# Patient Record
Sex: Male | Born: 1955 | Race: White | Hispanic: No | Marital: Married | Smoking: Former smoker
Health system: Southern US, Community
[De-identification: ages and names within clinical notes are randomized; demographics above are authoritative.]

## PROBLEM LIST (undated history)

## (undated) DIAGNOSIS — J40 Bronchitis, not specified as acute or chronic: Secondary | ICD-10-CM

## (undated) DIAGNOSIS — E039 Hypothyroidism, unspecified: Secondary | ICD-10-CM

## (undated) DIAGNOSIS — J310 Chronic rhinitis: Secondary | ICD-10-CM

## (undated) HISTORY — PX: TRIGGER FINGER RELEASE: SHX641

## (undated) HISTORY — DX: Bronchitis, not specified as acute or chronic: J40

## (undated) HISTORY — DX: Hypothyroidism, unspecified: E03.9

## (undated) HISTORY — DX: Chronic rhinitis: J31.0

---

## 1999-05-28 ENCOUNTER — Encounter: Admission: RE | Admit: 1999-05-28 | Discharge: 1999-07-06 | Payer: Self-pay | Admitting: Orthopedic Surgery

## 1999-12-28 ENCOUNTER — Encounter: Admission: RE | Admit: 1999-12-28 | Discharge: 2000-01-31 | Payer: Self-pay | Admitting: Orthopedic Surgery

## 2001-03-03 ENCOUNTER — Encounter: Payer: Self-pay | Admitting: Occupational Medicine

## 2001-03-03 ENCOUNTER — Encounter: Admission: RE | Admit: 2001-03-03 | Discharge: 2001-03-03 | Payer: Self-pay | Admitting: Occupational Medicine

## 2004-10-04 ENCOUNTER — Encounter: Admission: RE | Admit: 2004-10-04 | Discharge: 2004-10-04 | Payer: Self-pay | Admitting: Internal Medicine

## 2010-08-16 ENCOUNTER — Encounter
Admission: RE | Admit: 2010-08-16 | Discharge: 2010-11-01 | Payer: Self-pay | Source: Home / Self Care | Attending: Sports Medicine | Admitting: Sports Medicine

## 2011-04-03 ENCOUNTER — Encounter: Payer: Self-pay | Admitting: Physician Assistant

## 2013-06-09 ENCOUNTER — Other Ambulatory Visit: Payer: Self-pay | Admitting: Nurse Practitioner

## 2013-07-11 ENCOUNTER — Other Ambulatory Visit: Payer: Self-pay | Admitting: Family Medicine

## 2013-07-13 NOTE — Telephone Encounter (Signed)
Last thyroids 06/08/12   ACM

## 2013-07-14 NOTE — Telephone Encounter (Signed)
Patient needs to be seen. Has exceeded time since last visit. Needs to bring all medications to next appointment.   

## 2013-07-15 ENCOUNTER — Telehealth: Payer: Self-pay | Admitting: Family Medicine

## 2013-07-15 NOTE — Telephone Encounter (Signed)
Spoke with wife and she will sch appt

## 2013-07-16 ENCOUNTER — Other Ambulatory Visit: Payer: Self-pay | Admitting: Family Medicine

## 2013-07-22 ENCOUNTER — Encounter: Payer: Self-pay | Admitting: Family Medicine

## 2013-07-22 ENCOUNTER — Ambulatory Visit (INDEPENDENT_AMBULATORY_CARE_PROVIDER_SITE_OTHER): Payer: BC Managed Care – PPO | Admitting: Family Medicine

## 2013-07-22 VITALS — BP 135/81 | HR 59 | Temp 96.3°F | Ht 68.0 in | Wt 235.0 lb

## 2013-07-22 DIAGNOSIS — M109 Gout, unspecified: Secondary | ICD-10-CM

## 2013-07-22 DIAGNOSIS — E039 Hypothyroidism, unspecified: Secondary | ICD-10-CM

## 2013-07-22 DIAGNOSIS — Z0000 Encounter for general adult medical examination without abnormal findings: Secondary | ICD-10-CM

## 2013-07-22 LAB — POCT CBC
Granulocyte percent: 54.4 %G (ref 37–80)
HCT, POC: 42.7 % — AB (ref 43.5–53.7)
Hemoglobin: 14.7 g/dL (ref 14.1–18.1)
Lymph, poc: 2.5 (ref 0.6–3.4)
MCH, POC: 31.6 pg — AB (ref 27–31.2)
MCHC: 34.5 g/dL (ref 31.8–35.4)
MCV: 91.6 fL (ref 80–97)
MPV: 6.5 fL (ref 0–99.8)
POC Granulocyte: 3.3 (ref 2–6.9)
POC LYMPH PERCENT: 40.4 %L (ref 10–50)
Platelet Count, POC: 253 10*3/uL (ref 142–424)
RBC: 4.7 M/uL (ref 4.69–6.13)
RDW, POC: 13.5 %
WBC: 6.1 10*3/uL (ref 4.6–10.2)

## 2013-07-22 MED ORDER — ALLOPURINOL 300 MG PO TABS: 300.0000 mg | ORAL_TABLET | Freq: Every day | ORAL | Status: DC

## 2013-07-22 MED ORDER — LEVOTHYROXINE SODIUM 25 MCG PO TABS: 25.0000 ug | ORAL_TABLET | Freq: Every day | ORAL | Status: DC

## 2013-07-22 NOTE — Progress Notes (Signed)
  Subjective:    Patient ID: Don Nelson, male    DOB: 1956/07/18, 57 y.o.   MRN: [id number]  HPI  This 57 y.o. male presents for evaluation of needing refills on medicine.  He has hx of gout And is taking allopurinol 300mg  and this is working well and he has used prednisone on a few Occasions since January for flares.  He is due for annual labs.  Review of Systems No chest pain, SOB, HA, dizziness, vision change, N/V, diarrhea, constipation, dysuria, urinary urgency or frequency, myalgias, arthralgias or rash.     Objective:   Physical Exam Vital signs noted  Well developed well nourished male.  HEENT - Head atraumatic Normocephalic                Eyes - PERRLA, Conjuctiva - clear Sclera- Clear EOMI                Ears - EAC's Wnl TM's Wnl Gross Hearing WNL                Nose - Nares patent                 Throat - oropharanx wnl Respiratory - Lungs CTA bilateral Cardiac - RRR S1 and S2 without murmur GI - Abdomen soft Nontender and bowel sounds active x 4 Extremities - No edema. Neuro - Grossly intact.       Assessment & Plan:  Gout - Plan: allopurinol (ZYLOPRIM) 300 MG tablet  Routine general medical examination at a health care facility - Plan: POCT CBC, CMP14+EGFR, PSA, total and free, Thyroid Panel With TSH, Lipid panel, Uric acid  Unspecified hypothyroidism - Plan: levothyroxine (SYNTHROID, LEVOTHROID) 25 MCG tablet, Thyroid Panel With TSH

## 2013-07-22 NOTE — Patient Instructions (Signed)
Gout  Gout is an inflammatory condition (arthritis) caused by a buildup of uric acid crystals in the joints. Uric acid is a chemical that is normally present in the blood. Under some circumstances, uric acid can form into crystals in your joints. This causes joint redness, soreness, and swelling (inflammation). Repeat attacks are common. Over time, uric acid crystals can form into masses (tophi) near a joint, causing disfigurement. Gout is treatable and often preventable.  CAUSES   The disease begins with elevated levels of uric acid in the blood. Uric acid is produced by your body when it breaks down a naturally found substance called purines. This also happens when you eat certain foods such as meats and fish. Causes of an elevated uric acid level include:   Being passed down from parent to child (heredity).   Diseases that cause increased uric acid production (obesity, psoriasis, some cancers).   Excessive alcohol use.   Diet, especially diets rich in meat and seafood.   Medicines, including certain cancer-fighting drugs (chemotherapy), diuretics, and aspirin.   Chronic kidney disease. The kidneys are no longer able to remove uric acid well.   Problems with metabolism.  Conditions strongly associated with gout include:   Obesity.   High blood pressure.   High cholesterol.   Diabetes.  Not everyone with elevated uric acid levels gets gout. It is not understood why some people get gout and others do not. Surgery, joint injury, and eating too much of certain foods are some of the factors that can lead to gout.  SYMPTOMS    An attack of gout comes on quickly. It causes intense pain with redness, swelling, and warmth in a joint.   Fever can occur.   Often, only one joint is involved. Certain joints are more commonly involved:   Base of the big toe.   Knee.   Ankle.   Wrist.   Finger.  Without treatment, an attack usually goes away in a few days to weeks. Between attacks, you usually will not have  symptoms, which is different from many other forms of arthritis.  DIAGNOSIS   Your caregiver will suspect gout based on your symptoms and exam. Removal of fluid from the joint (arthrocentesis) is done to check for uric acid crystals. Your caregiver will give you a medicine that numbs the area (local anesthetic) and use a needle to remove joint fluid for exam. Gout is confirmed when uric acid crystals are seen in joint fluid, using a special microscope. Sometimes, blood, urine, and X-ray tests are also used.  TREATMENT   There are 2 phases to gout treatment: treating the sudden onset (acute) attack and preventing attacks (prophylaxis).  Treatment of an Acute Attack   Medicines are used. These include anti-inflammatory medicines or steroid medicines.   An injection of steroid medicine into the affected joint is sometimes necessary.   The painful joint is rested. Movement can worsen the arthritis.   You may use warm or cold treatments on painful joints, depending which works best for you.   Discuss the use of coffee, vitamin C, or cherries with your caregiver. These may be helpful treatment options.  Treatment to Prevent Attacks  After the acute attack subsides, your caregiver may advise prophylactic medicine. These medicines either help your kidneys eliminate uric acid from your body or decrease your uric acid production. You may need to stay on these medicines for a very long time.  The early phase of treatment with prophylactic medicine can be associated   with an increase in acute gout attacks. For this reason, during the first few months of treatment, your caregiver may also advise you to take medicines usually used for acute gout treatment. Be sure you understand your caregiver's directions.  You should also discuss dietary treatment with your caregiver. Certain foods such as meats and fish can increase uric acid levels. Other foods such as dairy can decrease levels. Your caregiver can give you a list of foods  to avoid.  HOME CARE INSTRUCTIONS    Do not take aspirin to relieve pain. This raises uric acid levels.   Only take over-the-counter or prescription medicines for pain, discomfort, or fever as directed by your caregiver.   Rest the joint as much as possible. When in bed, keep sheets and blankets off painful areas.   Keep the affected joint raised (elevated).   Use crutches if the painful joint is in your leg.   Drink enough water and fluids to keep your urine clear or pale yellow. This helps your body get rid of uric acid. Do not drink alcoholic beverages. They slow the passage of uric acid.   Follow your caregiver's dietary instructions. Pay careful attention to the amount of protein you eat. Your daily diet should emphasize fruits, vegetables, whole grains, and fat-free or low-fat milk products.   Maintain a healthy body weight.  SEEK MEDICAL CARE IF:    You have an oral temperature above 102 F (38.9 C).   You develop diarrhea, vomiting, or any side effects from medicines.   You do not feel better in 24 hours, or you are getting worse.  SEEK IMMEDIATE MEDICAL CARE IF:    Your joint becomes suddenly more tender and you have:   Chills.   An oral temperature above 102 F (38.9 C), not controlled by medicine.  MAKE SURE YOU:    Understand these instructions.   Will watch your condition.   Will get help right away if you are not doing well or get worse.  Document Released: 10/18/2000 Document Revised: 01/13/2012 Document Reviewed: 01/29/2010  [employer] Patient Information 2014 [employer].

## 2013-07-23 ENCOUNTER — Other Ambulatory Visit: Payer: Self-pay | Admitting: Family Medicine

## 2013-07-23 DIAGNOSIS — E039 Hypothyroidism, unspecified: Secondary | ICD-10-CM

## 2013-07-23 LAB — CMP14+EGFR
ALT: 24 IU/L (ref 0–44)
AST: 22 IU/L (ref 0–40)
Albumin/Globulin Ratio: 1.4 (ref 1.1–2.5)
Albumin: 4.2 g/dL (ref 3.5–5.5)
Alkaline Phosphatase: 55 IU/L (ref 39–117)
BUN/Creatinine Ratio: 10 (ref 9–20)
BUN: 11 mg/dL (ref 6–24)
CO2: 23 mmol/L (ref 18–29)
Calcium: 9.2 mg/dL (ref 8.7–10.2)
Chloride: 101 mmol/L (ref 97–108)
Creatinine, Ser: 1.13 mg/dL (ref 0.76–1.27)
GFR calc Af Amer: 83 mL/min/{1.73_m2} (ref >59)
GFR calc non Af Amer: 72 mL/min/{1.73_m2} (ref >59)
Globulin, Total: 3 g/dL (ref 1.5–4.5)
Glucose: 77 mg/dL (ref 65–99)
Potassium: 4.2 mmol/L (ref 3.5–5.2)
Sodium: 141 mmol/L (ref 134–144)
Total Bilirubin: 0.9 mg/dL (ref 0.0–1.2)
Total Protein: 7.2 g/dL (ref 6.0–8.5)

## 2013-07-23 LAB — LIPID PANEL
Chol/HDL Ratio: 5.4 ratio — ABNORMAL HIGH (ref 0.0–5.0)
Cholesterol, Total: 177 mg/dL (ref 100–199)
HDL: 33 mg/dL — ABNORMAL LOW
LDL Calculated: 102 mg/dL — ABNORMAL HIGH (ref 0–99)
Triglycerides: 212 mg/dL — ABNORMAL HIGH (ref 0–149)
VLDL Cholesterol Cal: 42 mg/dL — ABNORMAL HIGH (ref 5–40)

## 2013-07-23 LAB — PSA, TOTAL AND FREE
PSA, Free Pct: 60 %
PSA, Free: 0.36 ng/mL
PSA: 0.6 ng/mL (ref 0.0–4.0)

## 2013-07-23 LAB — THYROID PANEL WITH TSH
Free Thyroxine Index: 1.7 (ref 1.2–4.9)
T3 Uptake Ratio: 31 % (ref 24–39)
T4, Total: 5.4 ug/dL (ref 4.5–12.0)
TSH: 5.82 u[IU]/mL — ABNORMAL HIGH (ref 0.450–4.500)

## 2013-07-23 LAB — URIC ACID: Uric Acid: 5.1 mg/dL (ref 3.7–8.6)

## 2013-07-23 MED ORDER — LEVOTHYROXINE SODIUM 50 MCG PO TABS: 50.0000 ug | ORAL_TABLET | Freq: Every day | ORAL | Status: DC

## 2013-08-02 ENCOUNTER — Telehealth: Payer: Self-pay | Admitting: *Deleted

## 2013-08-02 NOTE — Telephone Encounter (Addendum)
Message copied by Baltazar Apo on Mon Aug 02, 2013 10:35 AM ------      Message from: Deatra Canter      Created: Fri Jul 23, 2013  7:44 AM       Trigs elevated and take fish oil otc as directed.  TSH is elevated and take levothyroxine po qd and rx will be sent in. ------NOTIFIED AND VERBALIZED UNDERSTANDING.

## 2013-08-12 ENCOUNTER — Ambulatory Visit (INDEPENDENT_AMBULATORY_CARE_PROVIDER_SITE_OTHER): Payer: BC Managed Care – PPO

## 2013-08-12 DIAGNOSIS — Z23 Encounter for immunization: Secondary | ICD-10-CM

## 2013-08-15 ENCOUNTER — Other Ambulatory Visit: Payer: Self-pay | Admitting: Family Medicine

## 2013-10-17 ENCOUNTER — Other Ambulatory Visit: Payer: Self-pay | Admitting: Family Medicine

## 2014-01-11 ENCOUNTER — Other Ambulatory Visit: Payer: Self-pay | Admitting: Family Medicine

## 2014-04-12 ENCOUNTER — Other Ambulatory Visit: Payer: Self-pay | Admitting: Family Medicine

## 2014-04-20 ENCOUNTER — Other Ambulatory Visit: Payer: Self-pay | Admitting: Family Medicine

## 2014-04-22 ENCOUNTER — Other Ambulatory Visit: Payer: Self-pay | Admitting: Family Medicine

## 2014-04-26 ENCOUNTER — Encounter: Payer: Self-pay | Admitting: Family

## 2014-04-26 ENCOUNTER — Other Ambulatory Visit: Payer: Self-pay | Admitting: Family Medicine

## 2014-04-26 ENCOUNTER — Ambulatory Visit (INDEPENDENT_AMBULATORY_CARE_PROVIDER_SITE_OTHER): Payer: BC Managed Care – PPO | Admitting: Family

## 2014-04-26 VITALS — BP 126/80 | HR 64 | Temp 97.5°F | Ht 68.0 in | Wt 242.0 lb

## 2014-04-26 DIAGNOSIS — Z862 Personal history of diseases of the blood and blood-forming organs and certain disorders involving the immune mechanism: Secondary | ICD-10-CM

## 2014-04-26 DIAGNOSIS — E038 Other specified hypothyroidism: Secondary | ICD-10-CM

## 2014-04-26 DIAGNOSIS — Z8739 Personal history of other diseases of the musculoskeletal system and connective tissue: Secondary | ICD-10-CM

## 2014-04-26 DIAGNOSIS — Z0000 Encounter for general adult medical examination without abnormal findings: Secondary | ICD-10-CM

## 2014-04-26 DIAGNOSIS — Z8639 Personal history of other endocrine, nutritional and metabolic disease: Secondary | ICD-10-CM

## 2014-04-26 DIAGNOSIS — E039 Hypothyroidism, unspecified: Secondary | ICD-10-CM | POA: Insufficient documentation

## 2014-04-26 DIAGNOSIS — M109 Gout, unspecified: Secondary | ICD-10-CM

## 2014-04-26 DIAGNOSIS — M10471 Other secondary gout, right ankle and foot: Secondary | ICD-10-CM

## 2014-04-26 MED ORDER — LEVOTHYROXINE SODIUM 50 MCG PO TABS: 50.0000 ug | ORAL_TABLET | Freq: Every day | ORAL | Status: DC

## 2014-04-26 MED ORDER — ALLOPURINOL 300 MG PO TABS: 300.0000 mg | ORAL_TABLET | Freq: Every day | ORAL | Status: DC

## 2014-04-26 NOTE — Patient Instructions (Signed)

## 2014-04-26 NOTE — Addendum Note (Signed)
Addended by: Prescott GumLAND, Cedric Mcclaine M on: 04/26/2014 05:16 PM   Modules accepted: Orders

## 2014-04-26 NOTE — Progress Notes (Signed)
   Subjective:    Patient ID: Don Nelson, male    DOB: 01-10-1956, 58 y.o.   MRN: [id number]  Thyroid Problem Presents for follow-up visit. Patient reports no anxiety, cold intolerance, constipation, depressed mood, diarrhea, dry skin, fatigue or hair loss. Past treatments include levothyroxine. The treatment provided significant relief.  Gout Pt currently taking allopurinol daily. Pt states he has not had an attack in "years". No complaints at this time.     Review of Systems  Constitutional: Negative for fatigue.  Respiratory: Negative.   Cardiovascular: Negative.   Gastrointestinal: Negative.  Negative for diarrhea and constipation.  Endocrine: Negative for cold intolerance.  Genitourinary: Negative.   Neurological: Negative.   All other systems reviewed and are negative.      Objective:   Physical Exam  Vitals reviewed. Constitutional: He is oriented to person, place, and time. He appears well-developed and well-nourished. No distress.  HENT:  Head: Normocephalic.  Right Ear: External ear normal.  Left Ear: External ear normal.  Mouth/Throat: Oropharynx is clear and moist.  Eyes: Pupils are equal, round, and reactive to light. Right eye exhibits no discharge. Left eye exhibits no discharge.  Neck: Normal range of motion. Neck supple. No thyromegaly present.  Cardiovascular: Normal rate, regular rhythm, normal heart sounds and intact distal pulses.   No murmur heard. Pulmonary/Chest: Effort normal and breath sounds normal. No respiratory distress. He has no wheezes.  Abdominal: Soft. Bowel sounds are normal. He exhibits no distension. There is no tenderness.  Musculoskeletal: Normal range of motion. He exhibits no edema and no tenderness.  Neurological: He is alert and oriented to person, place, and time. He has normal reflexes. No cranial nerve deficit.  Skin: Skin is warm and dry. No rash noted. No erythema.  Psychiatric: He has a normal mood and affect. His behavior  is normal. Judgment and thought content normal.    BP 126/80  Pulse 64  Temp(Src) 97.5 F (36.4 C) (Oral)  Ht $R'5\' 8"'xJ$  (1.727 m)  Wt 242 lb (109.77 kg)  BMI 36.80 kg/m2       Assessment & Plan:  1. Other specified hypothyroidism - CMP14+EGFR - CBC With differential/Platelet - Lipid panel - Thyroid Panel With TSH  2. History of gout - Uric acid  3. Unspecified hypothyroidism - levothyroxine (SYNTHROID, LEVOTHROID) 50 MCG tablet; Take 1 tablet (50 mcg total) by mouth daily.  Dispense: 90 tablet; Refill: 3  4. 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  Dispense: 90 tablet; Refill: 3  6. Annual physical exam - CMP14+EGFR - Lipid panel - Vit D  25 hydroxy (rtn osteoporosis monitoring)   Continue all meds Labs pending Health Maintenance reviewed Diet and exercise encouraged RTO 1 year  Evelina Dun, FNP

## 2014-04-26 NOTE — Addendum Note (Signed)
Addended by: Prescott GumLAND, JESSICA M on: 04/26/2014 05:17 PM   Modules accepted: Orders

## 2014-04-27 ENCOUNTER — Telehealth: Payer: Self-pay | Admitting: Family Medicine

## 2014-04-27 LAB — CBC WITH DIFFERENTIAL
Basophils Absolute: 0 10*3/uL (ref 0.0–0.2)
Basos: 1 %
EOS ABS: 0.2 10*3/uL (ref 0.0–0.4)
EOS: 3 %
HCT: 39.5 % (ref 37.5–51.0)
Hemoglobin: 13.3 g/dL (ref 12.6–17.7)
IMMATURE GRANS (ABS): 0 10*3/uL (ref 0.0–0.1)
IMMATURE GRANULOCYTES: 0 %
Lymphocytes Absolute: 2.4 10*3/uL (ref 0.7–3.1)
Lymphs: 44 %
MCH: 31.4 pg (ref 26.6–33.0)
MCHC: 33.7 g/dL (ref 31.5–35.7)
MCV: 93 fL (ref 79–97)
MONOS ABS: 0.4 10*3/uL (ref 0.1–0.9)
Monocytes: 8 %
NEUTROS PCT: 44 %
Neutrophils Absolute: 2.4 10*3/uL (ref 1.4–7.0)
PLATELETS: 278 10*3/uL (ref 150–379)
RBC: 4.23 x10E6/uL (ref 4.14–5.80)
RDW: 14.3 % (ref 12.3–15.4)
WBC: 5.5 10*3/uL (ref 3.4–10.8)

## 2014-04-27 LAB — CMP14+EGFR
ALT: 31 IU/L (ref 0–44)
AST: 29 IU/L (ref 0–40)
Albumin/Globulin Ratio: 1.4 (ref 1.1–2.5)
Albumin: 4.4 g/dL (ref 3.5–5.5)
Alkaline Phosphatase: 54 IU/L (ref 39–117)
BUN/Creatinine Ratio: 11 (ref 9–20)
BUN: 14 mg/dL (ref 6–24)
CALCIUM: 9.5 mg/dL (ref 8.7–10.2)
CO2: 22 mmol/L (ref 18–29)
Chloride: 102 mmol/L (ref 97–108)
Creatinine, Ser: 1.31 mg/dL — ABNORMAL HIGH (ref 0.76–1.27)
GFR calc Af Amer: 69 mL/min/{1.73_m2} (ref >59)
GFR calc non Af Amer: 60 mL/min/{1.73_m2} (ref >59)
Globulin, Total: 3.1 g/dL (ref 1.5–4.5)
Glucose: 92 mg/dL (ref 65–99)
POTASSIUM: 4.9 mmol/L (ref 3.5–5.2)
SODIUM: 143 mmol/L (ref 134–144)
Total Bilirubin: 0.9 mg/dL (ref 0.0–1.2)
Total Protein: 7.5 g/dL (ref 6.0–8.5)

## 2014-04-27 LAB — URIC ACID: URIC ACID: 8.1 mg/dL (ref 3.7–8.6)

## 2014-04-27 LAB — LIPID PANEL
Chol/HDL Ratio: 5.6 ratio units — ABNORMAL HIGH (ref 0.0–5.0)
Cholesterol, Total: 179 mg/dL (ref 100–199)
HDL: 32 mg/dL — ABNORMAL LOW (ref >39)
LDL Calculated: 97 mg/dL (ref 0–99)
Triglycerides: 251 mg/dL — ABNORMAL HIGH (ref 0–149)
VLDL Cholesterol Cal: 50 mg/dL — ABNORMAL HIGH (ref 5–40)

## 2014-04-27 LAB — VITAMIN D 25 HYDROXY (VIT D DEFICIENCY, FRACTURES): Vit D, 25-Hydroxy: 22.7 ng/mL — ABNORMAL LOW (ref 30.0–100.0)

## 2014-04-27 LAB — THYROID PANEL WITH TSH
FREE THYROXINE INDEX: 1.9 (ref 1.2–4.9)
T3 UPTAKE RATIO: 28 % (ref 24–39)
T4 TOTAL: 6.7 ug/dL (ref 4.5–12.0)
TSH: 4.68 u[IU]/mL — AB (ref 0.450–4.500)

## 2014-04-27 NOTE — Telephone Encounter (Signed)
Message copied by Azalee CourseFULP, ASHLEY on Wed Apr 27, 2014  9:15 AM ------      Message from: Lendon ColonelHAWKS, [place] A      Created: Wed Apr 27, 2014  8:51 AM       CBC (WBC, Hgb, & plts) WNL      Triglycerides are high- RX sent to pharmacy and need to be on low fat diet and exercise      Uric Acid WNL      Thyroid levels stable      Kidney and liver function stable      Vit D levels low- Need to start on Vit D OTC ------

## 2014-04-28 NOTE — Telephone Encounter (Signed)
Patient aware.

## 2014-04-29 ENCOUNTER — Telehealth: Payer: Self-pay | Admitting: Family

## 2014-04-29 NOTE — Telephone Encounter (Signed)
Message copied by Doreatha MassedMOORE, MITZI on Fri Apr 29, 2014  3:09 PM ------      Message from: [place], [place] A      Created: Wed Apr 27, 2014  8:51 AM       CBC (WBC, Hgb, & plts) WNL      Triglycerides are high- RX sent to pharmacy and need to be on low fat diet and exercise      Uric Acid WNL      Thyroid levels stable      Kidney and liver function stable      Vit D levels low- Need to start on Vit D OTC ------

## 2014-04-30 NOTE — Telephone Encounter (Signed)
Patient aware of results on 6/24

## 2014-07-21 ENCOUNTER — Other Ambulatory Visit: Payer: Self-pay | Admitting: Family Medicine

## 2014-07-22 ENCOUNTER — Other Ambulatory Visit: Payer: Self-pay | Admitting: Family Medicine

## 2015-04-15 ENCOUNTER — Other Ambulatory Visit: Payer: Self-pay | Admitting: Family

## 2015-04-21 ENCOUNTER — Other Ambulatory Visit (INDEPENDENT_AMBULATORY_CARE_PROVIDER_SITE_OTHER): Payer: BLUE CROSS/BLUE SHIELD

## 2015-04-21 DIAGNOSIS — E559 Vitamin D deficiency, unspecified: Secondary | ICD-10-CM

## 2015-04-21 DIAGNOSIS — E039 Hypothyroidism, unspecified: Secondary | ICD-10-CM | POA: Diagnosis not present

## 2015-04-21 NOTE — Progress Notes (Signed)
Lab only 

## 2015-04-22 ENCOUNTER — Other Ambulatory Visit: Payer: Self-pay | Admitting: Family

## 2015-04-22 DIAGNOSIS — E559 Vitamin D deficiency, unspecified: Secondary | ICD-10-CM

## 2015-04-22 LAB — CMP14+EGFR
A/G RATIO: 1.3 (ref 1.1–2.5)
ALT: 20 IU/L (ref 0–44)
AST: 23 IU/L (ref 0–40)
Albumin: 4.1 g/dL (ref 3.5–5.5)
Alkaline Phosphatase: 49 IU/L (ref 39–117)
BUN/Creatinine Ratio: 11 (ref 9–20)
BUN: 15 mg/dL (ref 6–24)
Bilirubin Total: 1.6 mg/dL — ABNORMAL HIGH (ref 0.0–1.2)
CO2: 23 mmol/L (ref 18–29)
Calcium: 9.5 mg/dL (ref 8.7–10.2)
Chloride: 103 mmol/L (ref 97–108)
Creatinine, Ser: 1.31 mg/dL — ABNORMAL HIGH (ref 0.76–1.27)
GFR calc non Af Amer: 59 mL/min/{1.73_m2} — ABNORMAL LOW (ref >59)
GFR, EST AFRICAN AMERICAN: 68 mL/min/{1.73_m2} (ref >59)
GLOBULIN, TOTAL: 3.2 g/dL (ref 1.5–4.5)
Glucose: 117 mg/dL — ABNORMAL HIGH (ref 65–99)
POTASSIUM: 4.1 mmol/L (ref 3.5–5.2)
SODIUM: 142 mmol/L (ref 134–144)
Total Protein: 7.3 g/dL (ref 6.0–8.5)

## 2015-04-22 LAB — THYROID PANEL WITH TSH
Free Thyroxine Index: 1.9 (ref 1.2–4.9)
T3 Uptake Ratio: 29 % (ref 24–39)
T4, Total: 6.7 ug/dL (ref 4.5–12.0)
TSH: 3.82 u[IU]/mL (ref 0.450–4.500)

## 2015-04-22 LAB — LIPID PANEL
Chol/HDL Ratio: 5.6 ratio units — ABNORMAL HIGH (ref 0.0–5.0)
Cholesterol, Total: 173 mg/dL (ref 100–199)
HDL: 31 mg/dL — ABNORMAL LOW (ref >39)
LDL Calculated: 92 mg/dL (ref 0–99)
TRIGLYCERIDES: 250 mg/dL — AB (ref 0–149)
VLDL Cholesterol Cal: 50 mg/dL — ABNORMAL HIGH (ref 5–40)

## 2015-04-22 LAB — VITAMIN D 25 HYDROXY (VIT D DEFICIENCY, FRACTURES): Vit D, 25-Hydroxy: 18.8 ng/mL — ABNORMAL LOW (ref 30.0–100.0)

## 2015-04-22 MED ORDER — VITAMIN D (ERGOCALCIFEROL) 1.25 MG (50000 UNIT) PO CAPS: 50000.0000 [IU] | ORAL_CAPSULE | ORAL | Status: DC

## 2015-04-28 ENCOUNTER — Encounter: Payer: Self-pay | Admitting: Family

## 2015-04-28 ENCOUNTER — Ambulatory Visit (INDEPENDENT_AMBULATORY_CARE_PROVIDER_SITE_OTHER): Payer: BLUE CROSS/BLUE SHIELD | Admitting: Family

## 2015-04-28 VITALS — BP 131/82 | HR 64 | Temp 97.7°F | Ht 68.0 in | Wt 238.6 lb

## 2015-04-28 DIAGNOSIS — M10471 Other secondary gout, right ankle and foot: Secondary | ICD-10-CM | POA: Diagnosis not present

## 2015-04-28 DIAGNOSIS — E039 Hypothyroidism, unspecified: Secondary | ICD-10-CM | POA: Diagnosis not present

## 2015-04-28 DIAGNOSIS — Z0000 Encounter for general adult medical examination without abnormal findings: Secondary | ICD-10-CM | POA: Diagnosis not present

## 2015-04-28 DIAGNOSIS — E559 Vitamin D deficiency, unspecified: Secondary | ICD-10-CM | POA: Diagnosis not present

## 2015-04-28 MED ORDER — LEVOTHYROXINE SODIUM 50 MCG PO TABS: 50.0000 ug | ORAL_TABLET | Freq: Every day | ORAL | Status: DC

## 2015-04-28 MED ORDER — ALLOPURINOL 300 MG PO TABS: 300.0000 mg | ORAL_TABLET | Freq: Every day | ORAL | Status: DC

## 2015-04-28 NOTE — Patient Instructions (Signed)

## 2015-04-28 NOTE — Progress Notes (Signed)
   Subjective:    Patient ID: Don Nelson, male    DOB: [birth year], 59 y.o.   MRN: [id number]  Pt presents to the office for CPE.  Thyroid Problem Presents for follow-up visit. Patient reports no anxiety, cold intolerance, depressed mood, diarrhea, dry skin, fatigue or palpitations. The symptoms have been stable. Past treatments include levothyroxine. The treatment provided significant relief.  GOUT Pt states he takes the allopurinol daily and has not had a "gout attack" in years.     Review of Systems  Constitutional: Negative.  Negative for fatigue.  HENT: Negative.   Respiratory: Negative.   Cardiovascular: Negative.  Negative for palpitations.  Gastrointestinal: Negative.  Negative for diarrhea.  Endocrine: Negative.  Negative for cold intolerance.  Genitourinary: Negative.   Musculoskeletal: Negative.   Neurological: Negative.   Hematological: Negative.   Psychiatric/Behavioral: Negative.  The patient is not nervous/anxious.   All other systems reviewed and are negative.      Objective:   Physical Exam  Constitutional: He is oriented to person, place, and time. He appears well-developed and well-nourished. No distress.  HENT:  Head: Normocephalic.  Right Ear: External ear normal.  Left Ear: External ear normal.  Nose: Nose normal.  Mouth/Throat: Oropharynx is clear and moist.  Eyes: Pupils are equal, round, and reactive to light. Right eye exhibits no discharge. Left eye exhibits no discharge.  Neck: Normal range of motion. Neck supple. No thyromegaly present.  Cardiovascular: Normal rate, regular rhythm, normal heart sounds and intact distal pulses.   No murmur heard. Pulmonary/Chest: Effort normal and breath sounds normal. No respiratory distress. He has no wheezes.  Abdominal: Soft. Bowel sounds are normal. He exhibits no distension. There is no tenderness.  Musculoskeletal: Normal range of motion. He exhibits no edema or tenderness.  Neurological: He is alert and  oriented to person, place, and time. He has normal reflexes. No cranial nerve deficit.  Skin: Skin is warm and dry. No rash noted. No erythema.  Psychiatric: He has a normal mood and affect. His behavior is normal. Judgment and thought content normal.  Vitals reviewed.   BP 131/82 mmHg  Pulse 64  Temp(Src) 97.7 F (36.5 C) (Oral)  Ht 5\' 8"  (1.727 m)  Wt 238 lb 9.6 oz (108.228 kg)  BMI 36.29 kg/m2       Assessment & Plan:  1. Other secondary gout of right foot -Diet discussed - allopurinol (ZYLOPRIM) 300 MG tablet; Take 1 tablet (300 mg total) by mouth daily.  Dispense: 90 tablet; Refill: 3 - Uric acid  2. Hypothyroidism, unspecified hypothyroidism type - levothyroxine (SYNTHROID, LEVOTHROID) 50 MCG tablet; Take 1 tablet (50 mcg total) by mouth daily.  Dispense: 90 tablet; Refill: 3  3. Vitamin D deficiency  4. Annual physical exam - allopurinol (ZYLOPRIM) 300 MG tablet; Take 1 tablet (300 mg total) by mouth daily.  Dispense: 90 tablet; Refill: 3 - levothyroxine (SYNTHROID, LEVOTHROID) 50 MCG tablet; Take 1 tablet (50 mcg total) by mouth daily.  Dispense: 90 tablet; Refill: 3 - PSA Total+%Free (Serial) - Uric acid   Continue all meds Labs pending and labs discussed- Pt to avoid NSAID's Health Maintenance reviewed Diet and exercise encouraged RTO 1 year  Jannifer Rodney, FNP

## 2015-04-29 LAB — URIC ACID: URIC ACID: 5.2 mg/dL (ref 3.7–8.6)

## 2015-04-29 LAB — PSA TOTAL+% FREE (SERIAL)
PSA FREE: 0.35 ng/mL
PSA, Free Pct: 58.3 %
Prostate Specific Ag, Serum: 0.6 ng/mL (ref 0.0–4.0)

## 2015-06-12 ENCOUNTER — Other Ambulatory Visit: Payer: Self-pay | Admitting: Family

## 2015-07-14 ENCOUNTER — Other Ambulatory Visit: Payer: Self-pay | Admitting: Family

## 2015-07-19 ENCOUNTER — Other Ambulatory Visit: Payer: Self-pay | Admitting: Family

## 2015-10-19 ENCOUNTER — Other Ambulatory Visit: Payer: Self-pay | Admitting: Family

## 2015-10-19 ENCOUNTER — Ambulatory Visit (INDEPENDENT_AMBULATORY_CARE_PROVIDER_SITE_OTHER): Payer: BLUE CROSS/BLUE SHIELD

## 2015-10-19 ENCOUNTER — Encounter: Payer: Self-pay | Admitting: Family

## 2015-10-19 ENCOUNTER — Ambulatory Visit (INDEPENDENT_AMBULATORY_CARE_PROVIDER_SITE_OTHER): Payer: BLUE CROSS/BLUE SHIELD | Admitting: Family

## 2015-10-19 VITALS — BP 125/75 | HR 76 | Temp 97.1°F | Ht 66.0 in | Wt 233.4 lb

## 2015-10-19 DIAGNOSIS — Z01818 Encounter for other preprocedural examination: Secondary | ICD-10-CM | POA: Diagnosis not present

## 2015-10-19 NOTE — Progress Notes (Signed)
   Subjective:    Patient ID: Don Nelson, male    DOB: 01/14/1956, 59 y.o.   MRN: [id number]  HPI Pt presents to the office today for surgical clearance for a right knee surgery. Pt states he does not have a date and states it will be set up after "I get my clearance filled out". Pt states he has constant pain in his right knee of a 5-7 out 10. PT states the pain is becoming worse. Pt denies any headache, palpitations, SOB, or edema at this time.     Review of Systems  Constitutional: Negative.   HENT: Negative.   Respiratory: Negative.   Cardiovascular: Negative.   Gastrointestinal: Negative.   Endocrine: Negative.   Genitourinary: Negative.   Musculoskeletal: Negative.   Neurological: Negative.   Hematological: Negative.   Psychiatric/Behavioral: Negative.   All other systems reviewed and are negative.      Objective:   Physical Exam  Constitutional: He is oriented to person, place, and time. He appears well-developed and well-nourished. No distress.  HENT:  Head: Normocephalic.  Right Ear: External ear normal.  Left Ear: External ear normal.  Nose: Nose normal.  Mouth/Throat: Oropharynx is clear and moist.  Eyes: Pupils are equal, round, and reactive to light. Right eye exhibits no discharge. Left eye exhibits no discharge.  Neck: Normal range of motion. Neck supple. No thyromegaly present.  Cardiovascular: Normal rate, regular rhythm, normal heart sounds and intact distal pulses.   No murmur heard. Pulmonary/Chest: Effort normal and breath sounds normal. No respiratory distress. He has no wheezes.  Abdominal: Soft. Bowel sounds are normal. He exhibits no distension. There is no tenderness.  Musculoskeletal: Normal range of motion. He exhibits no edema or tenderness.  Neurological: He is alert and oriented to person, place, and time. He has normal reflexes. No cranial nerve deficit.  Skin: Skin is warm and dry. No rash noted. No erythema.  Psychiatric: He has a normal  mood and affect. His behavior is normal. Judgment and thought content normal.  Vitals reviewed.  EKG- Normal sinus rhythm   Chest X-ray WNL- Preliminary reading by Evelina Dun, FNP [facility]   BP 125/75 mmHg  Pulse 76  Temp(Src) 97.1 F (36.2 C) (Oral)  Ht _0  (1.676 m)  Wt 233 lb 6.4 oz (105.87 kg)  BMI 37.69 kg/m2     Assessment & Plan:  1. Pre-operative clearance - DG Chest 2 View; Future - EKG 12-Lead; Standing - EKG 12-Lead - CMP14+EGFR - CBC with Differential/Platelet   Continue all meds Labs pending Health Maintenance reviewed Diet and exercise encouraged RTO as needed and keep chronic follow up  Evelina Dun, FNP

## 2015-10-19 NOTE — Patient Instructions (Signed)

## 2015-10-20 LAB — CMP14+EGFR
ALK PHOS: 53 IU/L (ref 39–117)
ALT: 19 IU/L (ref 0–44)
AST: 26 IU/L (ref 0–40)
Albumin/Globulin Ratio: 1.3 (ref 1.1–2.5)
Albumin: 4.3 g/dL (ref 3.5–5.5)
BUN/Creatinine Ratio: 13 (ref 9–20)
BUN: 15 mg/dL (ref 6–24)
Bilirubin Total: 1.2 mg/dL (ref 0.0–1.2)
CALCIUM: 9.3 mg/dL (ref 8.7–10.2)
CHLORIDE: 99 mmol/L (ref 96–106)
CO2: 23 mmol/L (ref 18–29)
Creatinine, Ser: 1.16 mg/dL (ref 0.76–1.27)
GFR calc Af Amer: 79 mL/min/{1.73_m2} (ref >59)
GFR calc non Af Amer: 69 mL/min/{1.73_m2} (ref >59)
Globulin, Total: 3.2 g/dL (ref 1.5–4.5)
Glucose: 104 mg/dL — ABNORMAL HIGH (ref 65–99)
Potassium: 4 mmol/L (ref 3.5–5.2)
Sodium: 139 mmol/L (ref 134–144)
Total Protein: 7.5 g/dL (ref 6.0–8.5)

## 2015-10-20 LAB — CBC WITH DIFFERENTIAL/PLATELET
Basophils Absolute: 0.1 10*3/uL (ref 0.0–0.2)
Basos: 1 %
EOS (ABSOLUTE): 0.3 10*3/uL (ref 0.0–0.4)
Eos: 4 %
HEMATOCRIT: 39.9 % (ref 37.5–51.0)
Hemoglobin: 13.9 g/dL (ref 12.6–17.7)
IMMATURE GRANS (ABS): 0 10*3/uL (ref 0.0–0.1)
IMMATURE GRANULOCYTES: 0 %
LYMPHS: 37 %
Lymphocytes Absolute: 2.3 10*3/uL (ref 0.7–3.1)
MCH: 32.2 pg (ref 26.6–33.0)
MCHC: 34.8 g/dL (ref 31.5–35.7)
MCV: 92 fL (ref 79–97)
MONOS ABS: 0.4 10*3/uL (ref 0.1–0.9)
Monocytes: 7 %
NEUTROS ABS: 3.3 10*3/uL (ref 1.4–7.0)
Neutrophils: 51 %
Platelets: 259 10*3/uL (ref 150–379)
RBC: 4.32 x10E6/uL (ref 4.14–5.80)
RDW: 13.6 % (ref 12.3–15.4)
WBC: 6.3 10*3/uL (ref 3.4–10.8)

## 2015-10-24 ENCOUNTER — Telehealth: Payer: Self-pay | Admitting: Family

## 2015-10-24 ENCOUNTER — Encounter: Payer: Self-pay | Admitting: *Deleted

## 2015-10-24 NOTE — Telephone Encounter (Signed)
Form was faxed 

## 2015-10-24 NOTE — Telephone Encounter (Signed)
I have signed pt's paperwork. Please fax

## 2015-10-24 NOTE — Telephone Encounter (Signed)
Could we do a letter to clear patient for surgery and fax to the number given us?

## 2016-01-17 ENCOUNTER — Other Ambulatory Visit: Payer: Self-pay | Admitting: Family

## 2016-04-03 ENCOUNTER — Other Ambulatory Visit: Payer: Self-pay | Admitting: Family

## 2016-04-11 ENCOUNTER — Other Ambulatory Visit: Payer: Self-pay | Admitting: Family

## 2016-04-11 NOTE — Telephone Encounter (Signed)
Last seen 10/19/15  [facility]

## 2016-06-20 IMAGING — CR DG CHEST 2V
2 series · 2 of 2 positions shown · non-contrast
Comparison: None.

CLINICAL DATA: Preop knee arthroplasty.

EXAM:
CHEST  2 VIEW

[view not recorded (1 of 2)]
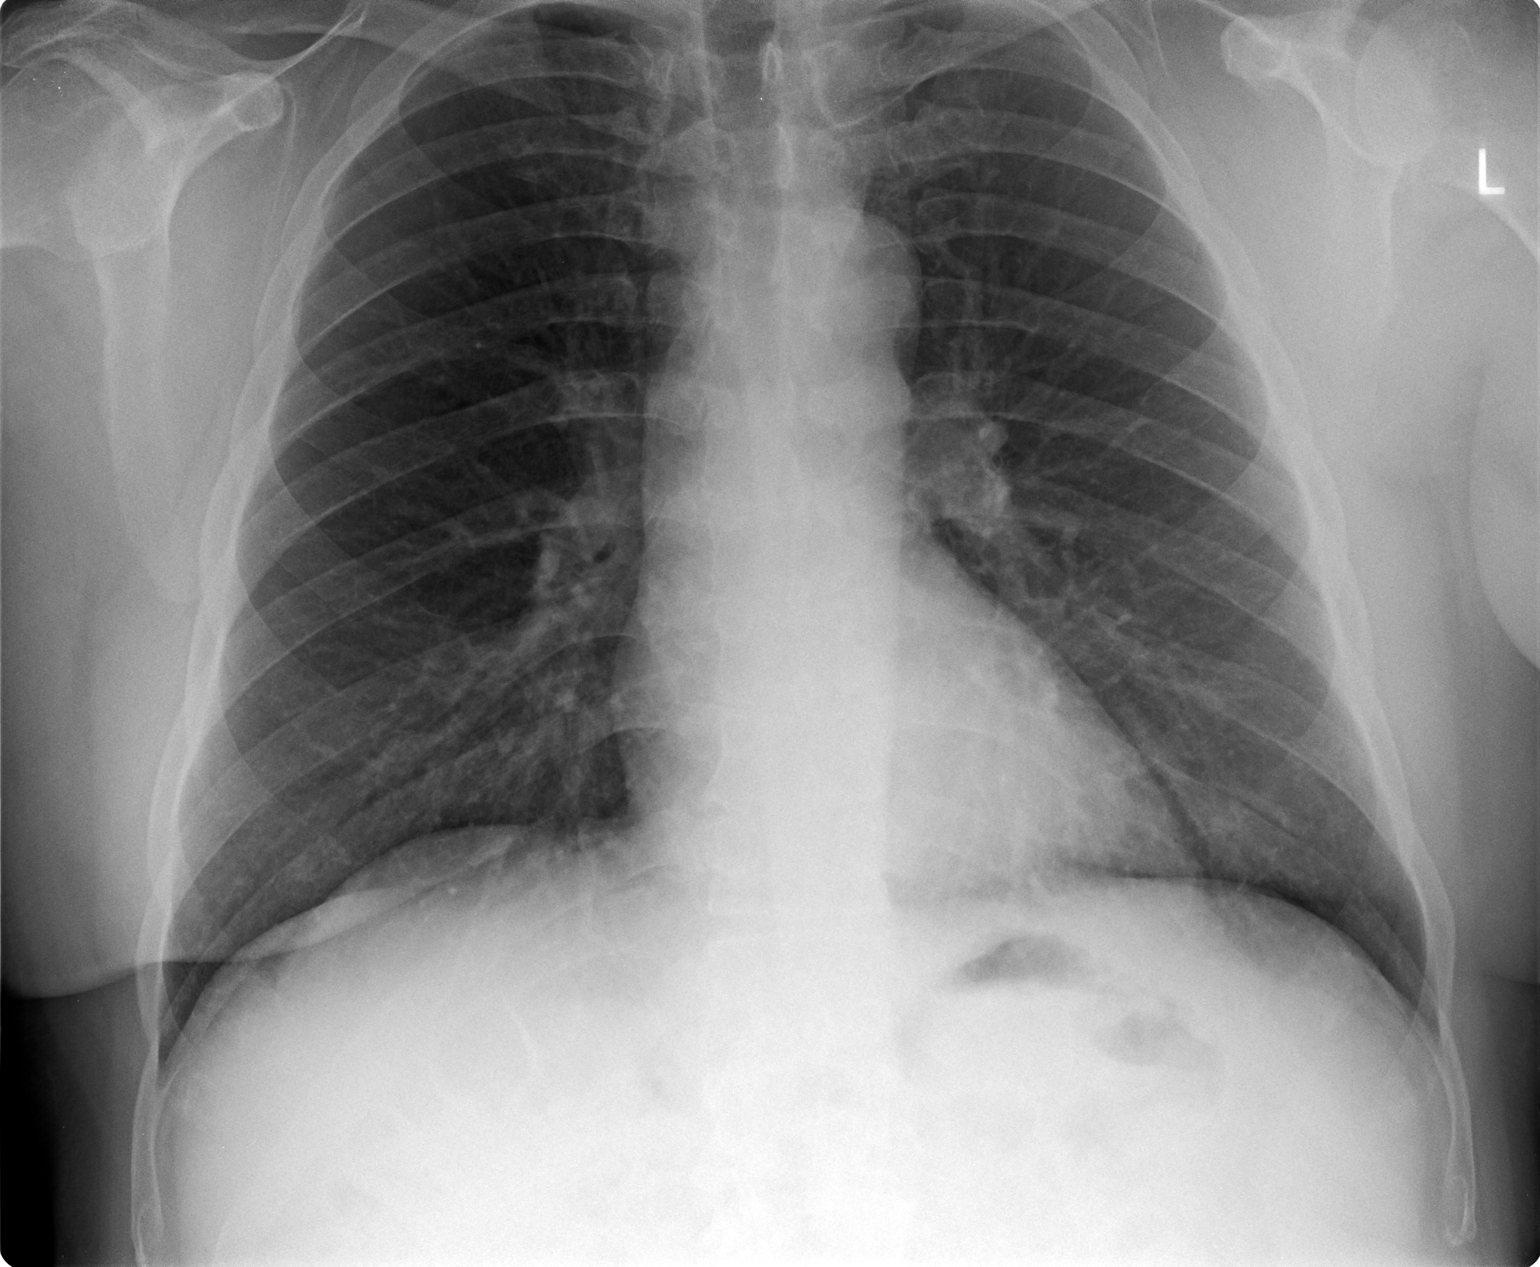

[view not recorded (2 of 2)]
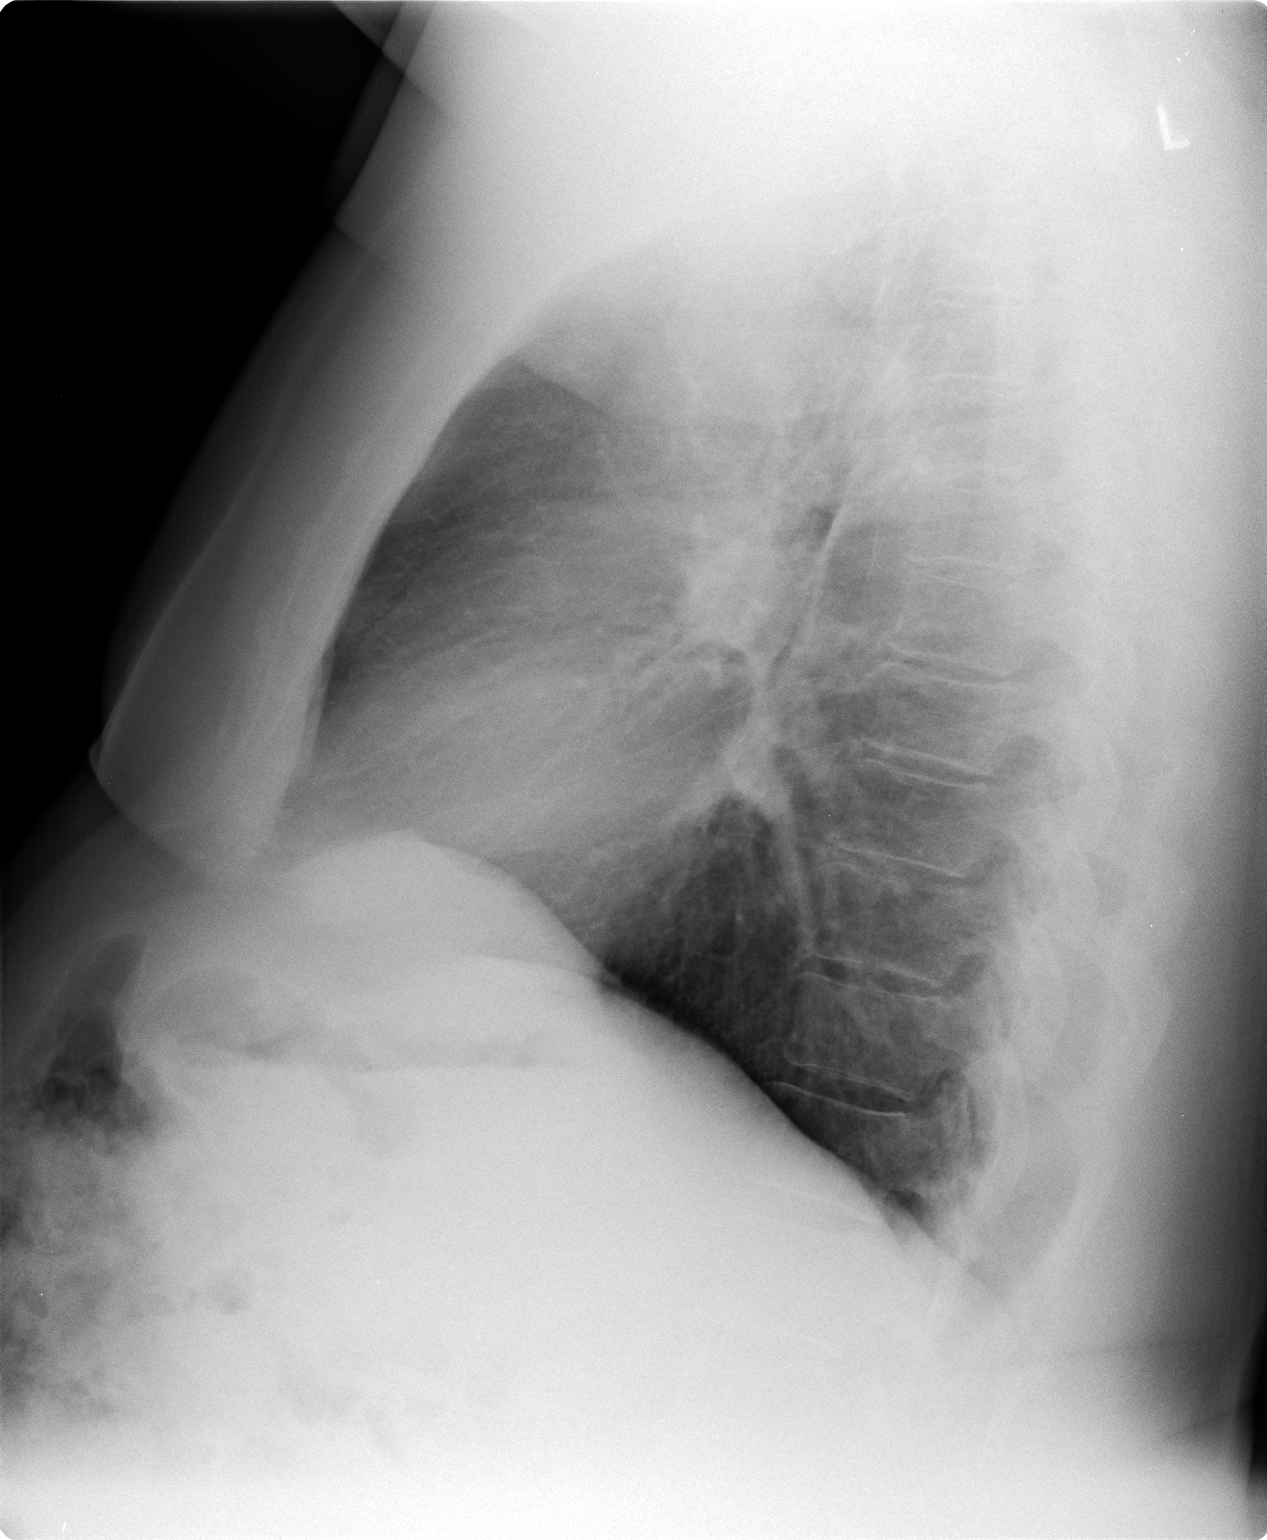

[2 of 2 positions shown; findings below may reference images not displayed]

FINDINGS: The heart size and mediastinal contours are within normal limits.
Both lungs are clear. The visualized skeletal structures are
unremarkable.
IMPRESSION: No active cardiopulmonary disease.

## 2016-07-05 ENCOUNTER — Other Ambulatory Visit: Payer: Self-pay | Admitting: Family

## 2016-07-05 NOTE — Telephone Encounter (Signed)
Give one refill but then patient needs to come in for a visit before next refill.

## 2016-07-05 NOTE — Telephone Encounter (Signed)
Left detailed message per dpr  

## 2016-07-07 ENCOUNTER — Other Ambulatory Visit: Payer: Self-pay | Admitting: Family

## 2016-07-09 NOTE — Telephone Encounter (Signed)
Patient NTBS for follow up and lab work  

## 2016-09-29 ENCOUNTER — Other Ambulatory Visit: Payer: Self-pay | Admitting: Family Medicine

## 2016-09-30 MED ORDER — LEVOTHYROXINE SODIUM 50 MCG PO TABS: 50.0000 ug | ORAL_TABLET | Freq: Every day | ORAL | 0 refills | Status: DC

## 2016-09-30 NOTE — Telephone Encounter (Signed)
Appt made for 12/5. #9 sent to [facility] to get him to appt

## 2016-09-30 NOTE — Addendum Note (Signed)
Addended by: Fawn KirkHOLT, CATHY on: 09/30/2016 10:54 AM   Modules accepted: Orders

## 2016-10-07 ENCOUNTER — Other Ambulatory Visit: Payer: Self-pay | Admitting: Nurse Practitioner

## 2016-10-08 ENCOUNTER — Encounter: Payer: Self-pay | Admitting: Family

## 2016-10-08 ENCOUNTER — Ambulatory Visit (INDEPENDENT_AMBULATORY_CARE_PROVIDER_SITE_OTHER): Payer: BLUE CROSS/BLUE SHIELD | Admitting: Family

## 2016-10-08 VITALS — BP 135/84 | HR 74 | Temp 97.1°F | Ht 66.0 in | Wt 234.8 lb

## 2016-10-08 DIAGNOSIS — M109 Gout, unspecified: Secondary | ICD-10-CM

## 2016-10-08 DIAGNOSIS — E039 Hypothyroidism, unspecified: Secondary | ICD-10-CM

## 2016-10-08 DIAGNOSIS — Z0000 Encounter for general adult medical examination without abnormal findings: Secondary | ICD-10-CM

## 2016-10-08 DIAGNOSIS — E559 Vitamin D deficiency, unspecified: Secondary | ICD-10-CM

## 2016-10-08 DIAGNOSIS — E669 Obesity, unspecified: Secondary | ICD-10-CM

## 2016-10-08 MED ORDER — INDOMETHACIN ER 75 MG PO CPCR: ORAL_CAPSULE | ORAL | 2 refills | Status: DC

## 2016-10-08 MED ORDER — ALLOPURINOL 300 MG PO TABS: ORAL_TABLET | ORAL | 0 refills | Status: DC

## 2016-10-08 MED ORDER — LEVOTHYROXINE SODIUM 50 MCG PO TABS: 50.0000 ug | ORAL_TABLET | Freq: Every day | ORAL | 2 refills | Status: DC

## 2016-10-08 NOTE — Progress Notes (Signed)
   Subjective:    Patient ID: Don Nelson, male    DOB: 1956/01/21, 60 y.o.   MRN: [id number]  Pt presents to the office for CPE.  Thyroid Problem  Presents for follow-up visit. Symptoms include dry skin. Patient reports no anxiety, cold intolerance, depressed mood, diarrhea, fatigue or palpitations. The symptoms have been stable. Past treatments include levothyroxine. The treatment provided significant relief.  GOUT Pt states he takes the allopurinol daily and has not had a "gout attack" in years.     Review of Systems  Constitutional: Negative.  Negative for fatigue.  HENT: Negative.   Respiratory: Negative.   Cardiovascular: Negative.  Negative for palpitations.  Gastrointestinal: Negative.  Negative for diarrhea.  Endocrine: Negative.  Negative for cold intolerance.  Genitourinary: Negative.   Musculoskeletal: Negative.   Neurological: Negative.   Hematological: Negative.   Psychiatric/Behavioral: Negative.  The patient is not nervous/anxious.   All other systems reviewed and are negative.      Objective:   Physical Exam  Constitutional: He is oriented to person, place, and time. He appears well-developed and well-nourished. No distress.  HENT:  Head: Normocephalic.  Right Ear: External ear normal.  Left Ear: External ear normal.  Nose: Nose normal.  Mouth/Throat: Oropharynx is clear and moist.  Eyes: Pupils are equal, round, and reactive to light. Right eye exhibits no discharge. Left eye exhibits no discharge.  Neck: Normal range of motion. Neck supple. No thyromegaly present.  Cardiovascular: Normal rate, regular rhythm, normal heart sounds and intact distal pulses.   No murmur heard. Pulmonary/Chest: Effort normal and breath sounds normal. No respiratory distress. He has no wheezes.  Abdominal: Soft. Bowel sounds are normal. He exhibits no distension. There is no tenderness.  Musculoskeletal: Normal range of motion. He exhibits no edema or tenderness.    Neurological: He is alert and oriented to person, place, and time. He has normal reflexes. No cranial nerve deficit.  Skin: Skin is warm and dry. No rash noted. No erythema.  Psychiatric: He has a normal mood and affect. His behavior is normal. Judgment and thought content normal.  Vitals reviewed.   BP 135/84   Pulse 74   Temp 97.1 F (36.2 C) (Oral)   Ht '5\' 6"'$  (1.676 m)   Wt 234 lb 12.8 oz (106.5 kg)   BMI 37.90 kg/m        Assessment & Plan:  1. Hypothyroidism, unspecified type - CMP14+EGFR - CBC with Differential/Platelet  2. Vitamin D deficiency - CMP14+EGFR - CBC with Differential/Platelet - VITAMIN D 25 Hydroxy (Vit-D Deficiency, Fractures)  3. Acute gout of right foot, unspecified cause - CMP14+EGFR - CBC with Differential/Platelet - Uric acid  4. Obesity (BMI 30-39.9) - CMP14+EGFR - CBC with Differential/Platelet  5. Annual physical exam  - CMP14+EGFR - CBC with Differential/Platelet - Lipid panel - Thyroid Panel With TSH - PSA, total and free - VITAMIN D 25 Hydroxy (Vit-D Deficiency, Fractures)   Continue all meds Labs pending Health Maintenance reviewed Diet and exercise encouraged RTO 1 year  Evelina Dun, FNP

## 2016-10-08 NOTE — Addendum Note (Signed)
Addended by: Almeta MonasSTONE, JANIE M on: 10/08/2016 04:58 PM   Modules accepted: Orders

## 2016-10-08 NOTE — Patient Instructions (Signed)

## 2016-10-09 LAB — CBC WITH DIFFERENTIAL/PLATELET
BASOS ABS: 0.1 10*3/uL (ref 0.0–0.2)
Basos: 1 %
EOS (ABSOLUTE): 0.2 10*3/uL (ref 0.0–0.4)
Eos: 3 %
HEMOGLOBIN: 14.1 g/dL (ref 13.0–17.7)
Hematocrit: 41.1 % (ref 37.5–51.0)
IMMATURE GRANS (ABS): 0 10*3/uL (ref 0.0–0.1)
IMMATURE GRANULOCYTES: 0 %
LYMPHS: 38 %
Lymphocytes Absolute: 2.1 10*3/uL (ref 0.7–3.1)
MCH: 32.3 pg (ref 26.6–33.0)
MCHC: 34.3 g/dL (ref 31.5–35.7)
MCV: 94 fL (ref 79–97)
MONOCYTES: 6 %
Monocytes Absolute: 0.4 10*3/uL (ref 0.1–0.9)
NEUTROS ABS: 2.8 10*3/uL (ref 1.4–7.0)
NEUTROS PCT: 52 %
PLATELETS: 255 10*3/uL (ref 150–379)
RBC: 4.37 x10E6/uL (ref 4.14–5.80)
RDW: 13.9 % (ref 12.3–15.4)
WBC: 5.5 10*3/uL (ref 3.4–10.8)

## 2016-10-09 LAB — LIPID PANEL
CHOLESTEROL TOTAL: 181 mg/dL (ref 100–199)
Chol/HDL Ratio: 6.5 ratio units — ABNORMAL HIGH (ref 0.0–5.0)
HDL: 28 mg/dL — AB (ref >39)
TRIGLYCERIDES: 582 mg/dL — AB (ref 0–149)

## 2016-10-09 LAB — CMP14+EGFR
ALT: 20 IU/L (ref 0–44)
AST: 22 IU/L (ref 0–40)
Albumin/Globulin Ratio: 1.3 (ref 1.2–2.2)
Albumin: 4.3 g/dL (ref 3.6–4.8)
Alkaline Phosphatase: 54 IU/L (ref 39–117)
BILIRUBIN TOTAL: 1 mg/dL (ref 0.0–1.2)
BUN/Creatinine Ratio: 12 (ref 10–24)
BUN: 15 mg/dL (ref 8–27)
CHLORIDE: 100 mmol/L (ref 96–106)
CO2: 25 mmol/L (ref 18–29)
Calcium: 9.4 mg/dL (ref 8.6–10.2)
Creatinine, Ser: 1.22 mg/dL (ref 0.76–1.27)
GFR calc non Af Amer: 64 mL/min/{1.73_m2} (ref >59)
GFR, EST AFRICAN AMERICAN: 74 mL/min/{1.73_m2} (ref >59)
GLUCOSE: 98 mg/dL (ref 65–99)
Globulin, Total: 3.3 g/dL (ref 1.5–4.5)
Potassium: 4.2 mmol/L (ref 3.5–5.2)
Sodium: 140 mmol/L (ref 134–144)
TOTAL PROTEIN: 7.6 g/dL (ref 6.0–8.5)

## 2016-10-09 LAB — URIC ACID: Uric Acid: 5.5 mg/dL (ref 3.7–8.6)

## 2016-10-09 LAB — PSA, TOTAL AND FREE
PROSTATE SPECIFIC AG, SERUM: 0.5 ng/mL (ref 0.0–4.0)
PSA FREE PCT: 68 %
PSA FREE: 0.34 ng/mL

## 2016-10-09 LAB — THYROID PANEL WITH TSH
Free Thyroxine Index: 1.9 (ref 1.2–4.9)
T3 Uptake Ratio: 28 % (ref 24–39)
T4, Total: 6.7 ug/dL (ref 4.5–12.0)
TSH: 6 u[IU]/mL — ABNORMAL HIGH (ref 0.450–4.500)

## 2016-10-09 LAB — VITAMIN D 25 HYDROXY (VIT D DEFICIENCY, FRACTURES): Vit D, 25-Hydroxy: 17.4 ng/mL — ABNORMAL LOW (ref 30.0–100.0)

## 2016-10-10 ENCOUNTER — Other Ambulatory Visit: Payer: Self-pay | Admitting: Family

## 2016-10-10 MED ORDER — ATORVASTATIN CALCIUM 20 MG PO TABS: 20.0000 mg | ORAL_TABLET | Freq: Every day | ORAL | 3 refills | Status: DC

## 2016-10-10 MED ORDER — VITAMIN D (ERGOCALCIFEROL) 1.25 MG (50000 UNIT) PO CAPS: 50000.0000 [IU] | ORAL_CAPSULE | ORAL | 3 refills | Status: DC

## 2016-10-10 MED ORDER — LEVOTHYROXINE SODIUM 75 MCG PO TABS: 75.0000 ug | ORAL_TABLET | Freq: Every day | ORAL | 1 refills | Status: DC

## 2016-10-24 ENCOUNTER — Encounter: Payer: Self-pay | Admitting: *Deleted

## 2017-01-09 ENCOUNTER — Other Ambulatory Visit: Payer: Self-pay | Admitting: Family

## 2017-04-03 ENCOUNTER — Other Ambulatory Visit: Payer: Self-pay | Admitting: Family

## 2017-04-09 ENCOUNTER — Other Ambulatory Visit: Payer: Self-pay | Admitting: Family

## 2017-08-20 ENCOUNTER — Other Ambulatory Visit: Payer: Self-pay | Admitting: *Deleted

## 2017-08-20 NOTE — Telephone Encounter (Signed)
Last seen 10/08/2016

## 2017-08-21 MED ORDER — ALLOPURINOL 300 MG PO TABS: ORAL_TABLET | ORAL | 0 refills | Status: DC

## 2017-09-18 ENCOUNTER — Ambulatory Visit: Payer: BLUE CROSS/BLUE SHIELD | Admitting: Family Medicine

## 2017-09-18 ENCOUNTER — Encounter (INDEPENDENT_AMBULATORY_CARE_PROVIDER_SITE_OTHER): Payer: Self-pay

## 2017-09-18 ENCOUNTER — Encounter: Payer: Self-pay | Admitting: Family Medicine

## 2017-09-18 VITALS — BP 157/95 | HR 65 | Temp 97.1°F | Ht 66.0 in | Wt 232.0 lb

## 2017-09-18 DIAGNOSIS — R030 Elevated blood-pressure reading, without diagnosis of hypertension: Secondary | ICD-10-CM | POA: Diagnosis not present

## 2017-09-18 DIAGNOSIS — N179 Acute kidney failure, unspecified: Secondary | ICD-10-CM

## 2017-09-18 NOTE — Progress Notes (Signed)
BP (!) 157/95   Pulse 65   Temp (!) 97.1 F (36.2 C) (Oral)   Ht 5' 6"  (1.676 m)   Wt 232 lb (105.2 kg)   BMI 37.45 kg/m    Subjective:    Patient ID: Don Nelson, male    DOB: 1956/02/05, 61 y.o.   MRN: [id number]  HPI: Don Nelson is a 61 y.o. male presenting on 09/18/2017 for Hypertension (blood pressure has recently started elevating, has been cutting back on salt intake, running around 180/110, has labwork from his employer, BP normal at that time) and Neck Pain (x 4-6 weeks, pain on both sides of neck)   HPI Elevated blood pressure Patient is coming in because he has been having elevated blood pressures more recently.  One month ago his blood pressures were perfectly normal but over the past month he has been taking it a few times at work and other places has been running as high as 180/110 down to 157/95.  He cannot recall anything that is changed in his diet or work or increased stress or anything along those lines.  He denies any chest pain or shortness of breath or vision difficulties.  He brings in lab work from his employer which is essentially normal except an elevated glucose and triglycerides but he was not fasting so they are in the normal range for nonfasting.  Patient also had a slightly elevated creatinine at 1.26 and we will recheck that today.  On 08/27/2017 which is when he had this exam and physical and blood work from his workplace his blood pressure was 120/80.  He is not currently on any medications but is concerned about whether or not he needs to be on some in the future.  He has had some headaches and flushing over the past short while.  He has reduced his salt intake and is working to get it down on his own.  He has had some tension in the back of his neck on both sides over the past 4-6 weeks but it does seem to get better at times and then worse at times and he has been trying to stretch it and use heating pads.  It is better today.  Relevant past medical,  surgical, family and social history reviewed and updated as indicated. Interim medical history since our last visit reviewed. Allergies and medications reviewed and updated.  Review of Systems  Constitutional: Negative for chills and fever.  HENT: Negative for sinus pressure.   Eyes: Negative for visual disturbance.  Respiratory: Negative for shortness of breath and wheezing.   Cardiovascular: Negative for chest pain and leg swelling.  Musculoskeletal: Positive for neck pain. Negative for back pain and gait problem.  Skin: Negative for rash.  Neurological: Positive for headaches. Negative for dizziness, weakness, light-headedness and numbness.  All other systems reviewed and are negative.   Per HPI unless specifically indicated above     Objective:    BP (!) 157/95   Pulse 65   Temp (!) 97.1 F (36.2 C) (Oral)   Ht 5' 6"  (1.676 m)   Wt 232 lb (105.2 kg)   BMI 37.45 kg/m   Wt Readings from Last 3 Encounters:  09/18/17 232 lb (105.2 kg)  10/08/16 234 lb 12.8 oz (106.5 kg)  10/19/15 233 lb 6.4 oz (105.9 kg)    Physical Exam  Constitutional: He is oriented to person, place, and time. He appears well-developed and well-nourished. No distress.  Eyes: Conjunctivae are normal. No scleral  icterus.  Neck: Neck supple. No thyromegaly present.  Cardiovascular: Normal rate, regular rhythm, normal heart sounds and intact distal pulses.  No murmur heard. Pulmonary/Chest: Effort normal and breath sounds normal. No respiratory distress. He has no wheezes. He has no rales.  Musculoskeletal: Normal range of motion. He exhibits no edema or tenderness (No tenderness in his neck today on exam).  Lymphadenopathy:    He has no cervical adenopathy.  Neurological: He is alert and oriented to person, place, and time. Coordination normal.  Skin: Skin is warm and dry. No rash noted. He is not diaphoretic.  Psychiatric: He has a normal mood and affect. His behavior is normal.  Nursing note and vitals  reviewed.     Assessment & Plan:   Problem List Items Addressed This Visit    None    Visit Diagnoses    Elevated BP without diagnosis of hypertension    -  Primary   AKI (acute kidney injury) ([place])       Relevant Orders   BMP8+EGFR (Completed)     We will monitor his blood pressure for now and he will check it at home and write down the numbers and bring them back with him  Follow up plan: Return in about 4 weeks (around 10/16/2017), or if symptoms worsen or fail to improve, for Blood pressure recheck.  Counseling provided for all of the vaccine components Orders Placed This Encounter  Procedures  . BMP8+EGFR    Caryl Pina, MD [facility] 09/18/2017, 4:48 PM

## 2017-09-19 LAB — BMP8+EGFR
BUN / CREAT RATIO: 13 (ref 10–24)
BUN: 17 mg/dL (ref 8–27)
CALCIUM: 9.5 mg/dL (ref 8.6–10.2)
CHLORIDE: 103 mmol/L (ref 96–106)
CO2: 23 mmol/L (ref 20–29)
CREATININE: 1.33 mg/dL — AB (ref 0.76–1.27)
GFR calc Af Amer: 66 mL/min/{1.73_m2} (ref >59)
GFR calc non Af Amer: 57 mL/min/{1.73_m2} — ABNORMAL LOW (ref >59)
GLUCOSE: 80 mg/dL (ref 65–99)
Potassium: 4.1 mmol/L (ref 3.5–5.2)
Sodium: 141 mmol/L (ref 134–144)

## 2017-09-21 ENCOUNTER — Other Ambulatory Visit: Payer: Self-pay | Admitting: Family

## 2017-09-22 NOTE — Telephone Encounter (Signed)
Last Vit D 10/18/16   17.4

## 2017-09-27 ENCOUNTER — Other Ambulatory Visit: Payer: Self-pay | Admitting: Family

## 2017-09-30 ENCOUNTER — Telehealth: Payer: Self-pay | Admitting: Family

## 2017-09-30 MED ORDER — LEVOTHYROXINE SODIUM 75 MCG PO TABS: 75.0000 ug | ORAL_TABLET | Freq: Every day | ORAL | 1 refills | Status: DC

## 2017-09-30 NOTE — Telephone Encounter (Signed)
Aware. 

## 2017-09-30 NOTE — Telephone Encounter (Signed)
Will refill, but patient has not had Thyroid checked since 10/2016. Ntbs

## 2017-10-09 ENCOUNTER — Ambulatory Visit: Payer: BLUE CROSS/BLUE SHIELD | Admitting: Family Medicine

## 2017-10-09 ENCOUNTER — Encounter: Payer: Self-pay | Admitting: Family Medicine

## 2017-10-09 VITALS — BP 149/88 | HR 68 | Temp 97.5°F | Ht 66.0 in | Wt 229.0 lb

## 2017-10-09 DIAGNOSIS — I10 Essential (primary) hypertension: Secondary | ICD-10-CM

## 2017-10-09 MED ORDER — LEVOTHYROXINE SODIUM 75 MCG PO TABS: 75.0000 ug | ORAL_TABLET | Freq: Every day | ORAL | 1 refills | Status: DC

## 2017-10-09 MED ORDER — AMLODIPINE BESYLATE 5 MG PO TABS: 5.0000 mg | ORAL_TABLET | Freq: Every day | ORAL | 3 refills | Status: DC

## 2017-10-09 NOTE — Progress Notes (Signed)
BP (!) 149/88   Pulse 68   Temp (!) 97.5 F (36.4 C) (Oral)   Ht 5' 6"  (1.676 m)   Wt 229 lb (103.9 kg)   BMI 36.96 kg/m    Subjective:    Patient ID: Don Nelson, male    DOB: 1956-08-29, 61 y.o.   MRN: [id number]  HPI: Don Nelson is a 60 y.o. male presenting on 10/09/2017 for Hypertension (follow up) and '   HPI Hypertension Patient is currently on no medication and we are watching it but he has been running just as high at home as he is here, and their blood pressure today is 149/80. Patient denies any lightheadedness or dizziness. Patient denies headaches, blurred vision, chest pains, shortness of breath, or weakness. Denies any side effects from medication and is content with current medication.   Relevant past medical, surgical, family and social history reviewed and updated as indicated. Interim medical history since our last visit reviewed. Allergies and medications reviewed and updated.  Review of Systems  Constitutional: Negative for chills and fever.  Respiratory: Negative for shortness of breath and wheezing.   Cardiovascular: Negative for chest pain and leg swelling.  Musculoskeletal: Negative for back pain and gait problem.  Skin: Negative for rash.  Neurological: Negative for dizziness, numbness and headaches.  All other systems reviewed and are negative.   Per HPI unless specifically indicated above        Objective:    BP (!) 149/88   Pulse 68   Temp (!) 97.5 F (36.4 C) (Oral)   Ht 5' 6"  (1.676 m)   Wt 229 lb (103.9 kg)   BMI 36.96 kg/m   Wt Readings from Last 3 Encounters:  10/09/17 229 lb (103.9 kg)  09/18/17 232 lb (105.2 kg)  10/08/16 234 lb 12.8 oz (106.5 kg)    Physical Exam  Constitutional: He is oriented to person, place, and time. He appears well-developed and well-nourished. No distress.  Eyes: Conjunctivae are normal. No scleral icterus.  Neck: Neck supple. No thyromegaly present.  Cardiovascular: Normal rate, regular  rhythm, normal heart sounds and intact distal pulses.  No murmur heard. Pulmonary/Chest: Effort normal and breath sounds normal. No respiratory distress. He has no wheezes. He has no rales.  Musculoskeletal: Normal range of motion. He exhibits no edema.  Lymphadenopathy:    He has no cervical adenopathy.  Neurological: He is alert and oriented to person, place, and time. Coordination normal.  Skin: Skin is warm and dry. No rash noted. He is not diaphoretic.  Psychiatric: He has a normal mood and affect. His behavior is normal.  Nursing note and vitals reviewed.   Results for orders placed or performed in visit on 09/18/17  [facility]  Result Value Ref Range   Glucose 80 65 - 99 mg/dL   BUN 17 8 - 27 mg/dL   Creatinine, Ser 1.33 (H) 0.76 - 1.27 mg/dL   GFR calc non Af Amer 57 (L) >59 mL/min/1.73   GFR calc Af Amer 66 >59 mL/min/1.73   BUN/Creatinine Ratio 13 10 - 24   Sodium 141 134 - 144 mmol/L   Potassium 4.1 3.5 - 5.2 mmol/L   Chloride 103 96 - 106 mmol/L   CO2 23 20 - 29 mmol/L   Calcium 9.5 8.6 - 10.2 mg/dL      Assessment & Plan:   Problem List Items Addressed This Visit      Cardiovascular and Mediastinum   Hypertension - Primary   Relevant Medications  amLODipine (NORVASC) 5 MG tablet       Follow up plan: Return in about 2 months (around 12/10/2017), or if symptoms worsen or fail to improve, for Hypertension recheck.  Counseling provided for all of the vaccine components No orders of the defined types were placed in this encounter.   Caryl Pina, MD [facility] 10/09/2017, 3:50 PM

## 2017-11-17 ENCOUNTER — Other Ambulatory Visit: Payer: Self-pay | Admitting: Family

## 2017-12-11 ENCOUNTER — Ambulatory Visit: Payer: BLUE CROSS/BLUE SHIELD | Admitting: Family Medicine

## 2017-12-11 ENCOUNTER — Encounter: Payer: Self-pay | Admitting: Family Medicine

## 2017-12-11 ENCOUNTER — Ambulatory Visit: Payer: BLUE CROSS/BLUE SHIELD | Admitting: Family

## 2017-12-11 VITALS — BP 112/67 | HR 75 | Temp 97.9°F | Ht 66.0 in | Wt 233.0 lb

## 2017-12-11 DIAGNOSIS — I10 Essential (primary) hypertension: Secondary | ICD-10-CM | POA: Diagnosis not present

## 2017-12-11 DIAGNOSIS — E039 Hypothyroidism, unspecified: Secondary | ICD-10-CM | POA: Diagnosis not present

## 2017-12-11 NOTE — Progress Notes (Signed)
BP 112/67   Pulse 75   Temp 97.9 F (36.6 C) (Oral)   Ht 5\' 6"  (1.676 m)   Wt 233 lb (105.7 kg)   BMI 37.61 kg/m    Subjective:    Patient ID: Don Nelson, male    DOB: 1956-09-25, 62 y.o.   MRN: [id number]  HPI: Don Nelson is a 62 y.o. male presenting on 12/11/2017 for Hypertension (2 mo rck)   HPI Hypertension Patient is currently on amlodipine 5 mg, and their blood pressure today is 112/67. Patient denies any lightheadedness or dizziness. Patient denies headaches, blurred vision, chest pains, shortness of breath, or weakness. Denies any side effects from medication and is content with current medication.   Hypothyroidism recheck Patient is coming in for thyroid recheck today as well. They deny any issues with hair changes or heat or cold problems or diarrhea or constipation. They deny any chest pain or palpitations. They are currently on levothyroxine 75 micrograms   Relevant past medical, surgical, family and social history reviewed and updated as indicated. Interim medical history since our last visit reviewed. Allergies and medications reviewed and updated.  Review of Systems  Constitutional: Negative for chills and fever.  Respiratory: Negative for shortness of breath and wheezing.   Cardiovascular: Negative for chest pain and leg swelling.  Musculoskeletal: Negative for back pain and gait problem.  Skin: Negative for rash.  Neurological: Negative for dizziness, weakness, light-headedness and headaches.  All other systems reviewed and are negative.   Per HPI unless specifically indicated above   Allergies as of 12/11/2017   No Known Allergies     Medication List        Accurate as of 12/11/17  3:33 PM. Always use your most recent med list.          allopurinol 300 MG tablet Commonly known as:  ZYLOPRIM TAKE 1 TABLET BY MOUTH EVERY DAY   amLODipine 5 MG tablet Commonly known as:  NORVASC Take 1 tablet (5 mg total) by mouth daily.   indomethacin 75 MG  CR capsule Commonly known as:  INDOCIN SR TAKE 1 CAPSULE BY MOUTH EVERY 8 TO 12 HOURS AS NEEDED   levothyroxine 75 MCG tablet Commonly known as:  SYNTHROID, LEVOTHROID Take 1 tablet (75 mcg total) by mouth daily before breakfast.   Vitamin D (Ergocalciferol) 50000 units Caps capsule Commonly known as:  DRISDOL TAKE 1 CAPSULE (50,000 UNITS TOTAL) BY MOUTH EVERY 7 (SEVEN) DAYS.          Objective:    BP 112/67   Pulse 75   Temp 97.9 F (36.6 C) (Oral)   Ht 5\' 6"  (1.676 m)   Wt 233 lb (105.7 kg)   BMI 37.61 kg/m   Wt Readings from Last 3 Encounters:  12/11/17 233 lb (105.7 kg)  10/09/17 229 lb (103.9 kg)  09/18/17 232 lb (105.2 kg)    Physical Exam  Constitutional: He is oriented to person, place, and time. He appears well-developed and well-nourished. No distress.  Eyes: Conjunctivae are normal. No scleral icterus.  Neck: Neck supple. No thyromegaly present.  Cardiovascular: Normal rate, regular rhythm, normal heart sounds and intact distal pulses.  No murmur heard. Pulmonary/Chest: Effort normal and breath sounds normal. No respiratory distress. He has no wheezes. He has no rales.  Musculoskeletal: Normal range of motion. He exhibits no edema.  Lymphadenopathy:    He has no cervical adenopathy.  Neurological: He is alert and oriented to person, place, and time. Coordination normal.  Skin: Skin is warm and dry. No rash noted. He is not diaphoretic.  Psychiatric: He has a normal mood and affect. His behavior is normal.  Nursing note and vitals reviewed.       Assessment & Plan:   Problem List Items Addressed This Visit      Cardiovascular and Mediastinum   Hypertension - Primary     Endocrine   Hypothyroidism   Relevant Orders   TSH       Follow up plan: Return if symptoms worsen or fail to improve.  Counseling provided for all of the vaccine components Orders Placed This Encounter  Procedures  . TSH  . TSH    Arville CareJoshua Gelsey Amyx, MD [facility]  [facility] 12/11/2017, 3:33 PM

## 2017-12-12 LAB — TSH: TSH: 2.74 u[IU]/mL (ref 0.450–4.500)

## 2017-12-19 ENCOUNTER — Other Ambulatory Visit: Payer: Self-pay

## 2017-12-19 MED ORDER — VITAMIN D (ERGOCALCIFEROL) 1.25 MG (50000 UNIT) PO CAPS: 50000.0000 [IU] | ORAL_CAPSULE | ORAL | 0 refills | Status: DC

## 2017-12-19 NOTE — Telephone Encounter (Signed)
Last Vit D 10/08/16  17.4 

## 2018-02-10 ENCOUNTER — Other Ambulatory Visit: Payer: Self-pay | Admitting: Family Medicine

## 2018-05-13 ENCOUNTER — Other Ambulatory Visit: Payer: Self-pay | Admitting: Family

## 2018-06-29 ENCOUNTER — Other Ambulatory Visit: Payer: Self-pay | Admitting: Family Medicine

## 2018-06-29 DIAGNOSIS — I10 Essential (primary) hypertension: Secondary | ICD-10-CM

## 2018-06-29 NOTE — Telephone Encounter (Signed)
Last seen 12/11/17  Dr Dettinger

## 2018-08-12 ENCOUNTER — Other Ambulatory Visit: Payer: Self-pay | Admitting: Family Medicine

## 2018-08-12 NOTE — Telephone Encounter (Signed)
Last seen 12/11/17

## 2018-09-27 ENCOUNTER — Other Ambulatory Visit: Payer: Self-pay | Admitting: Family Medicine

## 2018-09-27 DIAGNOSIS — I10 Essential (primary) hypertension: Secondary | ICD-10-CM

## 2018-09-28 ENCOUNTER — Other Ambulatory Visit: Payer: Self-pay | Admitting: Family Medicine

## 2018-09-28 NOTE — Telephone Encounter (Signed)
Last seen 12/2017 DETTINGER - ntbs

## 2018-10-02 ENCOUNTER — Other Ambulatory Visit: Payer: Self-pay | Admitting: Family Medicine

## 2018-10-02 DIAGNOSIS — I10 Essential (primary) hypertension: Secondary | ICD-10-CM

## 2018-10-02 NOTE — Telephone Encounter (Signed)
Last seen 12/11/17

## 2018-11-11 ENCOUNTER — Other Ambulatory Visit: Payer: Self-pay | Admitting: Family Medicine

## 2018-11-11 NOTE — Telephone Encounter (Signed)
Last seen 12/11/17

## 2018-12-28 ENCOUNTER — Other Ambulatory Visit: Payer: Self-pay | Admitting: Family Medicine

## 2018-12-28 DIAGNOSIS — I10 Essential (primary) hypertension: Secondary | ICD-10-CM

## 2018-12-29 ENCOUNTER — Telehealth: Payer: Self-pay | Admitting: Family Medicine

## 2019-01-05 ENCOUNTER — Other Ambulatory Visit: Payer: Self-pay | Admitting: Family Medicine

## 2019-01-05 DIAGNOSIS — I10 Essential (primary) hypertension: Secondary | ICD-10-CM

## 2019-01-20 ENCOUNTER — Ambulatory Visit: Payer: BLUE CROSS/BLUE SHIELD | Admitting: Family Medicine

## 2019-01-20 ENCOUNTER — Other Ambulatory Visit: Payer: Self-pay

## 2019-01-20 ENCOUNTER — Encounter: Payer: Self-pay | Admitting: Family Medicine

## 2019-01-20 VITALS — BP 155/91 | HR 60 | Temp 97.1°F | Ht 66.0 in | Wt 229.4 lb

## 2019-01-20 DIAGNOSIS — E782 Mixed hyperlipidemia: Secondary | ICD-10-CM

## 2019-01-20 DIAGNOSIS — I10 Essential (primary) hypertension: Secondary | ICD-10-CM

## 2019-01-20 DIAGNOSIS — E039 Hypothyroidism, unspecified: Secondary | ICD-10-CM

## 2019-01-20 DIAGNOSIS — E669 Obesity, unspecified: Secondary | ICD-10-CM

## 2019-01-20 DIAGNOSIS — E785 Hyperlipidemia, unspecified: Secondary | ICD-10-CM | POA: Insufficient documentation

## 2019-01-20 DIAGNOSIS — E559 Vitamin D deficiency, unspecified: Secondary | ICD-10-CM | POA: Diagnosis not present

## 2019-01-20 MED ORDER — ALLOPURINOL 300 MG PO TABS: 300.0000 mg | ORAL_TABLET | Freq: Every day | ORAL | 3 refills | Status: DC

## 2019-01-20 MED ORDER — LEVOTHYROXINE SODIUM 75 MCG PO TABS: 75.0000 ug | ORAL_TABLET | Freq: Every day | ORAL | 3 refills | Status: DC

## 2019-01-20 MED ORDER — AMLODIPINE BESYLATE 5 MG PO TABS: 5.0000 mg | ORAL_TABLET | Freq: Every day | ORAL | 3 refills | Status: DC

## 2019-01-20 MED ORDER — INDOMETHACIN ER 75 MG PO CPCR: ORAL_CAPSULE | ORAL | 2 refills | Status: DC

## 2019-01-20 NOTE — Progress Notes (Signed)
BP (!) 155/91   Pulse 60   Temp (!) 97.1 F (36.2 C) (Oral)   Ht 5' 6"  (1.676 m)   Wt 229 lb 6.4 oz (104.1 kg)   SpO2 97%   BMI 37.03 kg/m    Subjective:    Patient ID: Don Nelson, male    DOB: [birth year], 63 y.o.   MRN: [id number]  HPI: Don Nelson is a 64 y.o. male presenting on 01/20/2019 for Hypertension (check up of chronic medical conditons); Hypothyroidism; and Cough (x 1 month)   HPI Hypertension Patient is currently on amlodipine, and their blood pressure today is 155/91 but he says more recently it is been running a lot better at home in the 130s and says usually so daughter doctor's office couple weeks ago that it was running about the same in the 130s.. Patient denies any lightheadedness or dizziness. Patient denies headaches, blurred vision, chest pains, shortness of breath, or weakness. Denies any side effects from medication and is content with current medication.   Hypothyroidism recheck Patient is coming in for thyroid recheck today as well. They deny any issues with hair changes or heat or cold problems or diarrhea or constipation. They deny any chest pain or palpitations. They are currently on levothyroxine 45mcrograms   Hyperlipidemia Patient is coming in for recheck of his hyperlipidemia. The patient is currently taking no medication currently we are monitoring for now.. They deny any issues with myalgias or history of liver damage from it. They deny any focal numbness or weakness or chest pain.   Patient is coming in for vitamin D deficiency and recheck today.  Patient has been doing well on the vitamin D and continues to take it and denies any issues with it.  Relevant past medical, surgical, family and social history reviewed and updated as indicated. Interim medical history since our last visit reviewed. Allergies and medications reviewed and updated.  Review of Systems  Constitutional: Negative for chills and fever.  Eyes: Negative for visual  disturbance.  Respiratory: Negative for shortness of breath and wheezing.   Cardiovascular: Negative for chest pain and leg swelling.  Musculoskeletal: Negative for back pain and gait problem.  Skin: Negative for rash.  Neurological: Negative for dizziness, weakness and numbness.  All other systems reviewed and are negative.   Per HPI unless specifically indicated above   Allergies as of 01/20/2019   No Known Allergies     Medication List       Accurate as of January 20, 2019 10:57 AM. Always use your most recent med list.        allopurinol 300 MG tablet Commonly known as:  ZYLOPRIM TAKE 1 TABLET BY MOUTH EVERY DAY   amLODipine 5 MG tablet Commonly known as:  NORVASC TAKE 1 TABLET BY MOUTH EVERY DAY   indomethacin 75 MG CR capsule Commonly known as:  INDOCIN SR TAKE 1 CAPSULE BY MOUTH EVERY 8 TO 12 HOURS AS NEEDED   levothyroxine 75 MCG tablet Commonly known as:  SYNTHROID, LEVOTHROID TAKE 1 TABLET (75 MCG TOTAL) BY MOUTH DAILY BEFORE BREAKFAST.          Objective:    BP (!) 155/91   Pulse 60   Temp (!) 97.1 F (36.2 C) (Oral)   Ht 5' 6"  (1.676 m)   Wt 229 lb 6.4 oz (104.1 kg)   SpO2 97%   BMI 37.03 kg/m   Wt Readings from Last 3 Encounters:  01/20/19 229 lb 6.4 oz (104.1 kg)  12/11/17 233 lb (105.7 kg)  10/09/17 229 lb (103.9 kg)    Physical Exam Vitals signs and nursing note reviewed.  Constitutional:      General: He is not in acute distress.    Appearance: He is well-developed. He is not diaphoretic.  Eyes:     General: No scleral icterus.    Conjunctiva/sclera: Conjunctivae normal.  Neck:     Musculoskeletal: Neck supple.     Thyroid: No thyromegaly.  Cardiovascular:     Rate and Rhythm: Normal rate and regular rhythm.     Heart sounds: Normal heart sounds. No murmur.  Pulmonary:     Effort: Pulmonary effort is normal. No respiratory distress.     Breath sounds: Normal breath sounds. No wheezing.  Musculoskeletal: Normal range of motion.   Lymphadenopathy:     Cervical: No cervical adenopathy.  Skin:    General: Skin is warm and dry.     Findings: No rash.  Neurological:     Mental Status: He is alert and oriented to person, place, and time.     Coordination: Coordination normal.  Psychiatric:        Behavior: Behavior normal.     Results for orders placed or performed in visit on 12/11/17  TSH  Result Value Ref Range   TSH 2.740 0.450 - 4.500 uIU/mL      Assessment & Plan:   Problem List Items Addressed This Visit      Cardiovascular and Mediastinum   Hypertension   Relevant Medications   amLODipine (NORVASC) 5 MG tablet   Other Relevant Orders   CMP14+EGFR (Completed)     Endocrine   Hypothyroidism - Primary   Relevant Medications   levothyroxine (SYNTHROID, LEVOTHROID) 75 MCG tablet   Other Relevant Orders   CBC with Differential/Platelet (Completed)   CMP14+EGFR (Completed)   TSH (Completed)     Other   Vitamin D deficiency   Relevant Orders   VITAMIN D 25 Hydroxy (Vit-D Deficiency, Fractures) (Completed)   Obesity (BMI 30-39.9)   Hyperlipidemia   Relevant Medications   amLODipine (NORVASC) 5 MG tablet   Other Relevant Orders   Lipid panel (Completed)      The 10-year ASCVD risk score Mikey Bussing DC Jr., et al., 2013) is: 21.5%   Values used to calculate the score:     Age: 20 years     Sex: Male     Is Non-Hispanic African American: No     Diabetic: No     Tobacco smoker: No     Systolic Blood Pressure: 254 mmHg     Is BP treated: Yes     HDL Cholesterol: 28 mg/dL     Total Cholesterol: 181 mg/dL  Discussed ASCVD risk and possibility of statin will discuss it further Comes in.  He wants to hold off for now.  Follow up plan: Return in about 3 months (around 04/22/2019), or if symptoms worsen or fail to improve, for Thyroid and hypertension recheck.  Counseling provided for all of the vaccine components No orders of the defined types were placed in this encounter.   Caryl Pina,  MD [facility] 01/20/2019, 10:57 AM

## 2019-01-20 NOTE — Patient Instructions (Signed)
The 10-year ASCVD risk score Denman George DC Montez Hageman., et al., 2013) is: 21.5%   Values used to calculate the score:     Age: 63 years     Sex: Male     Is Non-Hispanic African American: No     Diabetic: No     Tobacco smoker: No     Systolic Blood Pressure: 155 mmHg     Is BP treated: Yes     HDL Cholesterol: 28 mg/dL     Total Cholesterol: 181 mg/dL

## 2019-01-21 LAB — CMP14+EGFR
ALBUMIN: 4.1 g/dL (ref 3.8–4.8)
ALT: 21 IU/L (ref 0–44)
AST: 26 IU/L (ref 0–40)
Albumin/Globulin Ratio: 1.3 (ref 1.2–2.2)
Alkaline Phosphatase: 63 IU/L (ref 39–117)
BUN/Creatinine Ratio: 8 — ABNORMAL LOW (ref 10–24)
BUN: 11 mg/dL (ref 8–27)
Bilirubin Total: 1.4 mg/dL — ABNORMAL HIGH (ref 0.0–1.2)
CALCIUM: 9.4 mg/dL (ref 8.6–10.2)
CO2: 24 mmol/L (ref 20–29)
Chloride: 103 mmol/L (ref 96–106)
Creatinine, Ser: 1.33 mg/dL — ABNORMAL HIGH (ref 0.76–1.27)
GFR calc Af Amer: 66 mL/min/{1.73_m2} (ref >59)
GFR, EST NON AFRICAN AMERICAN: 57 mL/min/{1.73_m2} — AB (ref >59)
Globulin, Total: 3.2 g/dL (ref 1.5–4.5)
Glucose: 105 mg/dL — ABNORMAL HIGH (ref 65–99)
Potassium: 4.3 mmol/L (ref 3.5–5.2)
Sodium: 139 mmol/L (ref 134–144)
TOTAL PROTEIN: 7.3 g/dL (ref 6.0–8.5)

## 2019-01-21 LAB — LIPID PANEL
Chol/HDL Ratio: 5.3 ratio — ABNORMAL HIGH (ref 0.0–5.0)
Cholesterol, Total: 187 mg/dL (ref 100–199)
HDL: 35 mg/dL — ABNORMAL LOW (ref >39)
LDL Calculated: 111 mg/dL — ABNORMAL HIGH (ref 0–99)
Triglycerides: 203 mg/dL — ABNORMAL HIGH (ref 0–149)
VLDL CHOLESTEROL CAL: 41 mg/dL — AB (ref 5–40)

## 2019-01-21 LAB — CBC WITH DIFFERENTIAL/PLATELET
Basophils Absolute: 0.1 10*3/uL (ref 0.0–0.2)
Basos: 1 %
EOS (ABSOLUTE): 0.2 10*3/uL (ref 0.0–0.4)
Eos: 4 %
Hematocrit: 41.5 % (ref 37.5–51.0)
Hemoglobin: 14.1 g/dL (ref 13.0–17.7)
IMMATURE GRANULOCYTES: 0 %
Immature Grans (Abs): 0 10*3/uL (ref 0.0–0.1)
Lymphocytes Absolute: 2.2 10*3/uL (ref 0.7–3.1)
Lymphs: 34 %
MCH: 30.1 pg (ref 26.6–33.0)
MCHC: 34 g/dL (ref 31.5–35.7)
MCV: 89 fL (ref 79–97)
Monocytes Absolute: 0.5 10*3/uL (ref 0.1–0.9)
Monocytes: 7 %
Neutrophils Absolute: 3.4 10*3/uL (ref 1.4–7.0)
Neutrophils: 54 %
Platelets: 330 10*3/uL (ref 150–450)
RBC: 4.69 x10E6/uL (ref 4.14–5.80)
RDW: 13.8 % (ref 11.6–15.4)
WBC: 6.3 10*3/uL (ref 3.4–10.8)

## 2019-01-21 LAB — TSH: TSH: 2.65 u[IU]/mL (ref 0.450–4.500)

## 2019-01-21 LAB — VITAMIN D 25 HYDROXY (VIT D DEFICIENCY, FRACTURES): Vit D, 25-Hydroxy: 23.7 ng/mL — ABNORMAL LOW (ref 30.0–100.0)

## 2019-04-23 ENCOUNTER — Ambulatory Visit: Payer: BLUE CROSS/BLUE SHIELD | Admitting: Family Medicine

## 2019-05-17 ENCOUNTER — Ambulatory Visit: Payer: BC Managed Care – PPO | Admitting: Family Medicine

## 2019-05-22 ENCOUNTER — Other Ambulatory Visit: Payer: Self-pay | Admitting: Family Medicine

## 2019-09-24 ENCOUNTER — Encounter: Payer: Self-pay | Admitting: Family Medicine

## 2019-09-24 ENCOUNTER — Other Ambulatory Visit: Payer: Self-pay

## 2019-09-24 ENCOUNTER — Ambulatory Visit (INDEPENDENT_AMBULATORY_CARE_PROVIDER_SITE_OTHER): Payer: BC Managed Care – PPO | Admitting: Family Medicine

## 2019-09-24 DIAGNOSIS — R509 Fever, unspecified: Secondary | ICD-10-CM | POA: Diagnosis not present

## 2019-09-24 DIAGNOSIS — U071 COVID-19: Secondary | ICD-10-CM

## 2019-09-24 DIAGNOSIS — R5081 Fever presenting with conditions classified elsewhere: Secondary | ICD-10-CM

## 2019-09-24 MED ORDER — IBUPROFEN 600 MG PO TABS: 600.0000 mg | ORAL_TABLET | Freq: Four times a day (QID) | ORAL | 0 refills | Status: DC | PRN

## 2019-09-24 NOTE — Progress Notes (Signed)
Virtual Visit via Telephone Note  I connected with Don Nelson on 09/24/19 at 5:12 PM by telephone and verified that I am speaking with the correct person using two identifiers. Don Nelson is currently located at home and nobody is currently with him during this visit. The provider, Gwenlyn Fudge, FNP is located in their office at time of visit.  I discussed the limitations, risks, security and privacy concerns of performing an evaluation and management service by telephone and the availability of in person appointments. I also discussed with the patient that there may be a patient responsible charge related to this service. The patient expressed understanding and agreed to proceed.  Subjective: PCP: Dettinger, Elige Radon, MD  Chief Complaint  Patient presents with  . Fever  . Fatigue   Patient c/o fever and fatigue that comes and goes every day. He is taking Tylenol 1,000 mg QHS and wakes up with a fever in the morning.  He was taking it more regularly but feels it is not helping bring his fever down.  He is positive for COVID-19.  He states that his quarantine is up as of tomorrow which would mark 10 days.  ROS: Per HPI  Current Outpatient Medications:  .  allopurinol (ZYLOPRIM) 300 MG tablet, Take 1 tablet (300 mg total) by mouth daily., Disp: 90 tablet, Rfl: 3 .  amLODipine (NORVASC) 5 MG tablet, Take 1 tablet (5 mg total) by mouth daily., Disp: 90 tablet, Rfl: 3 .  indomethacin (INDOCIN SR) 75 MG CR capsule, TAKE 1 CAPSULE BY MOUTH EVERY 8 TO 12 HOURS AS NEEDED, Disp: 30 capsule, Rfl: 2 .  levothyroxine (SYNTHROID) 75 MCG tablet, TAKE 1 TABLET (75 MCG TOTAL) BY MOUTH DAILY BEFORE BREAKFAST., Disp: 90 tablet, Rfl: 1  No Known Allergies Past Medical History:  Diagnosis Date  . Bronchitis   . Gout   . Hypothyroidism   . Rhinitis     Observations/Objective: A&O  No respiratory distress or wheezing audible over the phone Mood, judgement, and thought processes all WNL   Assessment and Plan: 1. Fever due to COVID-19 - Advised patient to remain in quarantine until he has had 3 days with no fever and no medication to mask or reduce that fever.  Encouraged him to alternate ibuprofen and Tylenol to keep his fever down.  He may take ibuprofen and then 3 hours later take Tylenol.  If he continues to alternate like this he will be taking each of them every 6 hours. - ibuprofen (ADVIL) 600 MG tablet; Take 1 tablet (600 mg total) by mouth every 6 (six) hours as needed for fever.  Dispense: 30 tablet; Refill: 0   Follow Up Instructions:  I discussed the assessment and treatment plan with the patient. The patient was provided an opportunity to ask questions and all were answered. The patient agreed with the plan and demonstrated an understanding of the instructions.   The patient was advised to call back or seek an in-person evaluation if the symptoms worsen or if the condition fails to improve as anticipated.  The above assessment and management plan was discussed with the patient. The patient verbalized understanding of and has agreed to the management plan. Patient is aware to call the clinic if symptoms persist or worsen. Patient is aware when to return to the clinic for a follow-up visit. Patient educated on when it is appropriate to go to the emergency department.   Time call ended: 5:19 PM  I provided 9 minutes  of non-face-to-face time during this encounter.  Hendricks Limes, MSN, APRN, FNP-C [facility] 09/24/19

## 2019-11-01 ENCOUNTER — Other Ambulatory Visit: Payer: Self-pay | Admitting: Family Medicine

## 2019-11-11 ENCOUNTER — Other Ambulatory Visit: Payer: Self-pay | Admitting: Family Medicine

## 2019-11-11 DIAGNOSIS — I10 Essential (primary) hypertension: Secondary | ICD-10-CM

## 2020-01-26 ENCOUNTER — Other Ambulatory Visit: Payer: Self-pay | Admitting: Family Medicine

## 2020-01-26 DIAGNOSIS — I10 Essential (primary) hypertension: Secondary | ICD-10-CM

## 2020-01-27 NOTE — Telephone Encounter (Signed)
Dettinger. NTBS LOV 01/20/19 30 days BP given 01/06/20

## 2020-01-27 NOTE — Telephone Encounter (Signed)
Left message - please call the office to set up appointment with provider to get medication refills.

## 2020-01-31 ENCOUNTER — Other Ambulatory Visit: Payer: Self-pay | Admitting: Family Medicine

## 2020-01-31 DIAGNOSIS — I10 Essential (primary) hypertension: Secondary | ICD-10-CM

## 2020-02-02 ENCOUNTER — Encounter: Payer: Self-pay | Admitting: Family Medicine

## 2020-02-02 ENCOUNTER — Telehealth (INDEPENDENT_AMBULATORY_CARE_PROVIDER_SITE_OTHER): Payer: BC Managed Care – PPO | Admitting: Family Medicine

## 2020-02-02 DIAGNOSIS — E782 Mixed hyperlipidemia: Secondary | ICD-10-CM

## 2020-02-02 DIAGNOSIS — E039 Hypothyroidism, unspecified: Secondary | ICD-10-CM | POA: Diagnosis not present

## 2020-02-02 DIAGNOSIS — I10 Essential (primary) hypertension: Secondary | ICD-10-CM

## 2020-02-02 MED ORDER — ALLOPURINOL 300 MG PO TABS: 300.0000 mg | ORAL_TABLET | Freq: Every day | ORAL | 3 refills | Status: DC

## 2020-02-02 MED ORDER — AMLODIPINE BESYLATE 5 MG PO TABS: 5.0000 mg | ORAL_TABLET | Freq: Every day | ORAL | 3 refills | Status: DC

## 2020-02-02 MED ORDER — LEVOTHYROXINE SODIUM 75 MCG PO TABS: 75.0000 ug | ORAL_TABLET | Freq: Every day | ORAL | 3 refills | Status: DC

## 2020-02-02 NOTE — Progress Notes (Signed)
Virtual Visit via MyChart video note  I connected with Don Nelson on 02/02/20 at 0949 by video and verified that I am speaking with the correct person using two identifiers. Don Nelson is currently located at home and no other people are currently with her during visit. The provider, Fransisca Kaufmann Dettinger, MD is located in their office at time of visit.  Call ended at [phone]  I discussed the limitations, risks, security and privacy concerns of performing an evaluation and management service by video and the availability of in person appointments. I also discussed with the patient that there may be a patient responsible charge related to this service. The patient expressed understanding and agreed to proceed.   History and Present Illness: Hypothyroidism recheck Patient is coming in for thyroid recheck today as well. They deny any issues with hair changes or heat or cold problems or diarrhea or constipation. They deny any chest pain or palpitations. They are currently on levothyroxine 62mcrograms   Hypertension Patient is currently on amlodipine, and their blood pressure today is 155/90. Patient denies any lightheadedness or dizziness. Patient denies headaches, blurred vision, chest pains, shortness of breath, or weakness. Denies any side effects from medication and is content with current medication.   Hyperlipidemia Patient is coming in for recheck of his hyperlipidemia. The patient is currently taking diet control. They deny any issues with myalgias or history of liver damage from it. They deny any focal numbness or weakness or chest pain.   Gout Last attack: none in past year Attacks this year: none Medication: allopurinol and indomethacin Location of attacks: right foot   No diagnosis found.  Outpatient Encounter Medications as of 02/02/2020  Medication Sig  . allopurinol (ZYLOPRIM) 300 MG tablet Take 1 tablet (300 mg total) by mouth daily.  .Marland KitchenamLODipine (NORVASC) 5 MG tablet  TAKE 1 TABLET (5 MG TOTAL) BY MOUTH DAILY. (NEEDS TO BE SEEN BEFORE NEXT REFILL)  . ibuprofen (ADVIL) 600 MG tablet Take 1 tablet (600 mg total) by mouth every 6 (six) hours as needed for fever.  . indomethacin (INDOCIN SR) 75 MG CR capsule TAKE 1 CAPSULE BY MOUTH EVERY 8 TO 12 HOURS AS NEEDED  . levothyroxine (SYNTHROID) 75 MCG tablet TAKE 1 TABLET (75 MCG TOTAL) BY MOUTH DAILY BEFORE BREAKFAST.   No facility-administered encounter medications on file as of 02/02/2020.    Review of Systems  Constitutional: Negative for chills and fever.  Eyes: Negative for visual disturbance.  Respiratory: Negative for shortness of breath and wheezing.   Cardiovascular: Negative for chest pain and leg swelling.  Musculoskeletal: Negative for back pain and gait problem.  Skin: Negative for rash.  Neurological: Negative for dizziness, weakness and numbness.  All other systems reviewed and are negative.   Observations/Objective: Patient sounds and looks comfortable and in no acute distress  Assessment and Plan: Problem List Items Addressed This Visit      Cardiovascular and Mediastinum   Hypertension - Primary   Relevant Medications   amLODipine (NORVASC) 5 MG tablet   Other Relevant Orders   CMP14+EGFR     Endocrine   Hypothyroidism   Relevant Medications   levothyroxine (SYNTHROID) 75 MCG tablet   Other Relevant Orders   CBC with Differential/Platelet   TSH     Other   Hyperlipidemia   Relevant Medications   amLODipine (NORVASC) 5 MG tablet   Other Relevant Orders   Lipid panel      Continue current medication, monitor your blood pressure  little more closely, maybe come in and check it. Follow up plan: Return in about 6 months (around 08/03/2020), or if symptoms worsen or fail to improve, for hypothyroidism.     I discussed the assessment and treatment plan with the patient. The patient was provided an opportunity to ask questions and all were answered. The patient agreed with the  plan and demonstrated an understanding of the instructions.   The patient was advised to call back or seek an in-person evaluation if the symptoms worsen or if the condition fails to improve as anticipated.  The above assessment and management plan was discussed with the patient. The patient verbalized understanding of and has agreed to the management plan. Patient is aware to call the clinic if symptoms persist or worsen. Patient is aware when to return to the clinic for a follow-up visit. Patient educated on when it is appropriate to go to the emergency department.    I provided 9 minutes of non-face-to-face time during this encounter.    Worthy Rancher, MD

## 2020-02-03 ENCOUNTER — Other Ambulatory Visit: Payer: BC Managed Care – PPO

## 2020-02-03 ENCOUNTER — Other Ambulatory Visit: Payer: Self-pay

## 2020-02-03 DIAGNOSIS — I10 Essential (primary) hypertension: Secondary | ICD-10-CM

## 2020-02-03 DIAGNOSIS — E782 Mixed hyperlipidemia: Secondary | ICD-10-CM

## 2020-02-03 DIAGNOSIS — E039 Hypothyroidism, unspecified: Secondary | ICD-10-CM

## 2020-02-04 LAB — CMP14+EGFR
ALT: 21 IU/L (ref 0–44)
AST: 19 IU/L (ref 0–40)
Albumin/Globulin Ratio: 1.3 (ref 1.2–2.2)
Albumin: 4.3 g/dL (ref 3.8–4.8)
Alkaline Phosphatase: 61 IU/L (ref 39–117)
BUN/Creatinine Ratio: 14 (ref 10–24)
BUN: 17 mg/dL (ref 8–27)
Bilirubin Total: 1.2 mg/dL (ref 0.0–1.2)
CO2: 22 mmol/L (ref 20–29)
Calcium: 9.7 mg/dL (ref 8.6–10.2)
Chloride: 101 mmol/L (ref 96–106)
Creatinine, Ser: 1.18 mg/dL (ref 0.76–1.27)
GFR calc Af Amer: 75 mL/min/{1.73_m2} (ref >59)
GFR calc non Af Amer: 65 mL/min/{1.73_m2} (ref >59)
Globulin, Total: 3.4 g/dL (ref 1.5–4.5)
Glucose: 120 mg/dL — ABNORMAL HIGH (ref 65–99)
Potassium: 4 mmol/L (ref 3.5–5.2)
Sodium: 138 mmol/L (ref 134–144)
Total Protein: 7.7 g/dL (ref 6.0–8.5)

## 2020-02-04 LAB — CBC WITH DIFFERENTIAL/PLATELET
Basophils Absolute: 0.1 10*3/uL (ref 0.0–0.2)
Basos: 1 %
EOS (ABSOLUTE): 0.2 10*3/uL (ref 0.0–0.4)
Eos: 4 %
Hematocrit: 45.2 % (ref 37.5–51.0)
Hemoglobin: 15.5 g/dL (ref 13.0–17.7)
Immature Grans (Abs): 0 10*3/uL (ref 0.0–0.1)
Immature Granulocytes: 0 %
Lymphocytes Absolute: 2.6 10*3/uL (ref 0.7–3.1)
Lymphs: 42 %
MCH: 32.4 pg (ref 26.6–33.0)
MCHC: 34.3 g/dL (ref 31.5–35.7)
MCV: 94 fL (ref 79–97)
Monocytes Absolute: 0.5 10*3/uL (ref 0.1–0.9)
Monocytes: 7 %
Neutrophils Absolute: 2.9 10*3/uL (ref 1.4–7.0)
Neutrophils: 46 %
Platelets: 303 10*3/uL (ref 150–450)
RBC: 4.79 x10E6/uL (ref 4.14–5.80)
RDW: 13.4 % (ref 11.6–15.4)
WBC: 6.3 10*3/uL (ref 3.4–10.8)

## 2020-02-04 LAB — LIPID PANEL
Chol/HDL Ratio: 5.6 ratio — ABNORMAL HIGH (ref 0.0–5.0)
Cholesterol, Total: 201 mg/dL — ABNORMAL HIGH (ref 100–199)
HDL: 36 mg/dL — ABNORMAL LOW (ref >39)
LDL Chol Calc (NIH): 117 mg/dL — ABNORMAL HIGH (ref 0–99)
Triglycerides: 275 mg/dL — ABNORMAL HIGH (ref 0–149)
VLDL Cholesterol Cal: 48 mg/dL — ABNORMAL HIGH (ref 5–40)

## 2020-02-04 LAB — TSH: TSH: 4.49 u[IU]/mL (ref 0.450–4.500)

## 2020-02-09 LAB — HGB A1C W/O EAG: Hgb A1c MFr Bld: 5.3 % (ref 4.8–5.6)

## 2020-02-09 LAB — SPECIMEN STATUS REPORT

## 2021-02-07 ENCOUNTER — Other Ambulatory Visit: Payer: Self-pay | Admitting: Family Medicine

## 2021-02-08 ENCOUNTER — Other Ambulatory Visit: Payer: Self-pay | Admitting: Family Medicine

## 2021-02-08 DIAGNOSIS — I10 Essential (primary) hypertension: Secondary | ICD-10-CM

## 2021-02-12 ENCOUNTER — Telehealth: Payer: Self-pay

## 2021-02-12 ENCOUNTER — Other Ambulatory Visit: Payer: Self-pay

## 2021-02-12 DIAGNOSIS — I10 Essential (primary) hypertension: Secondary | ICD-10-CM

## 2021-02-12 MED ORDER — AMLODIPINE BESYLATE 5 MG PO TABS: 5.0000 mg | ORAL_TABLET | Freq: Every day | ORAL | 0 refills | Status: DC

## 2021-02-12 MED ORDER — LEVOTHYROXINE SODIUM 75 MCG PO TABS: 75.0000 ug | ORAL_TABLET | Freq: Every day | ORAL | 0 refills | Status: DC

## 2021-02-12 MED ORDER — ALLOPURINOL 300 MG PO TABS: 300.0000 mg | ORAL_TABLET | Freq: Every day | ORAL | 0 refills | Status: DC

## 2021-02-12 NOTE — Telephone Encounter (Signed)
  Prescription Request  02/12/2021  What is the name of the medication or equipment? allopurinol (ZYLOPRIM) 300 MG tablet  Have you contacted your pharmacy to request a refill? (if applicable) no  [facility]  Pt scheduled for Dettinger's next available 03/16/2021. Can a refill be sent in till apt?   Patient notified that their request is being sent to the clinical staff for review and that they should receive a response within 2 business days.

## 2021-02-12 NOTE — Telephone Encounter (Signed)
Medications refilled and sent to [facility].  Left message on home number informing.

## 2021-03-07 ENCOUNTER — Other Ambulatory Visit: Payer: Self-pay | Admitting: Family Medicine

## 2021-03-07 DIAGNOSIS — I10 Essential (primary) hypertension: Secondary | ICD-10-CM

## 2021-03-16 ENCOUNTER — Encounter: Payer: Self-pay | Admitting: Family Medicine

## 2021-03-16 ENCOUNTER — Other Ambulatory Visit: Payer: Self-pay

## 2021-03-16 ENCOUNTER — Ambulatory Visit (INDEPENDENT_AMBULATORY_CARE_PROVIDER_SITE_OTHER): Payer: BC Managed Care – PPO | Admitting: Family Medicine

## 2021-03-16 VITALS — BP 127/78 | HR 72 | Wt 230.0 lb

## 2021-03-16 DIAGNOSIS — E782 Mixed hyperlipidemia: Secondary | ICD-10-CM

## 2021-03-16 DIAGNOSIS — Z23 Encounter for immunization: Secondary | ICD-10-CM

## 2021-03-16 DIAGNOSIS — M109 Gout, unspecified: Secondary | ICD-10-CM

## 2021-03-16 DIAGNOSIS — E039 Hypothyroidism, unspecified: Secondary | ICD-10-CM | POA: Diagnosis not present

## 2021-03-16 DIAGNOSIS — Z125 Encounter for screening for malignant neoplasm of prostate: Secondary | ICD-10-CM

## 2021-03-16 DIAGNOSIS — I10 Essential (primary) hypertension: Secondary | ICD-10-CM | POA: Diagnosis not present

## 2021-03-16 MED ORDER — ALLOPURINOL 300 MG PO TABS: 300.0000 mg | ORAL_TABLET | Freq: Every day | ORAL | 3 refills | Status: DC

## 2021-03-16 MED ORDER — AMLODIPINE BESYLATE 5 MG PO TABS: 5.0000 mg | ORAL_TABLET | Freq: Every day | ORAL | 3 refills | Status: DC

## 2021-03-16 MED ORDER — LEVOTHYROXINE SODIUM 75 MCG PO TABS: 75.0000 ug | ORAL_TABLET | Freq: Every day | ORAL | 3 refills | Status: DC

## 2021-03-16 NOTE — Progress Notes (Signed)
BP 127/78   Pulse 72   Wt 230 lb (104.3 kg)   SpO2 97%   BMI 37.12 kg/m    Subjective:   Patient ID: Don Nelson, male    DOB: 1955-11-09, 65 y.o.   MRN: [id number]  HPI: Don Nelson is a 65 y.o. male presenting on 03/16/2021 for No chief complaint on file.   HPI Hypothyroidism recheck Patient is coming in for thyroid recheck today as well. They deny any issues with hair changes or heat or cold problems or diarrhea or constipation. They deny any chest pain or palpitations. They are currently on levothyroxine 75 micrograms   Hypertension Patient is currently on amlodipine, and their blood pressure today is 127/70. Patient denies any lightheadedness or dizziness. Patient denies headaches, blurred vision, chest pains, shortness of breath, or weakness. Denies any side effects from medication and is content with current medication.   Hyperlipidemia Patient is coming in for recheck of his hyperlipidemia. The patient is currently taking no medication currently has been diet controlled, will check levels today. They deny any issues with myalgias or history of liver damage from it. They deny any focal numbness or weakness or chest pain.   Gout Last attack: More than a year ago, he cannot remember Attacks this year: None Medication: Allopurinol Location of attacks: Right foot  Relevant past medical, surgical, family and social history reviewed and updated as indicated. Interim medical history since our last visit reviewed. Allergies and medications reviewed and updated.  Review of Systems  Constitutional: Negative for chills and fever.  Eyes: Negative for discharge.  Respiratory: Negative for shortness of breath and wheezing.   Cardiovascular: Negative for chest pain and leg swelling.  Musculoskeletal: Positive for arthralgias (Patient has chronic left knee pain.  He fractured it when he was 71 and he just wanted to talk about it let us know that is there but does not want to do any  kind of injection or surgery at this point.). Negative for back pain and gait problem.  Skin: Negative for rash.  All other systems reviewed and are negative.   Per HPI unless specifically indicated above   Allergies as of 03/16/2021   No Known Allergies     Medication List       Accurate as of Mar 16, 2021  2:55 PM. If you have any questions, ask your nurse or doctor.        allopurinol 300 MG tablet Commonly known as: ZYLOPRIM TAKE 1 TABLET BY MOUTH EVERY DAY   amLODipine 5 MG tablet Commonly known as: NORVASC Take 1 tablet (5 mg total) by mouth daily.   indomethacin 75 MG CR capsule Commonly known as: INDOCIN SR TAKE 1 CAPSULE BY MOUTH EVERY 8 TO 12 HOURS AS NEEDED   levothyroxine 75 MCG tablet Commonly known as: SYNTHROID Take 1 tablet (75 mcg total) by mouth daily before breakfast.        Objective:   BP 127/78   Pulse 72   Wt 230 lb (104.3 kg)   SpO2 97%   BMI 37.12 kg/m   Wt Readings from Last 3 Encounters:  03/16/21 230 lb (104.3 kg)  01/20/19 229 lb 6.4 oz (104.1 kg)  12/11/17 233 lb (105.7 kg)    Physical Exam Vitals and nursing note reviewed.  Constitutional:      General: He is not in acute distress.    Appearance: He is well-developed. He is not diaphoretic.  Eyes:     General: No scleral  icterus.       Right eye: No discharge.     Conjunctiva/sclera: Conjunctivae normal.     Pupils: Pupils are equal, round, and reactive to light.  Neck:     Thyroid: No thyromegaly.  Cardiovascular:     Rate and Rhythm: Normal rate and regular rhythm.     Heart sounds: Normal heart sounds. No murmur heard.   Pulmonary:     Effort: Pulmonary effort is normal. No respiratory distress.     Breath sounds: Normal breath sounds. No wheezing.  Musculoskeletal:        General: Normal range of motion.     Cervical back: Neck supple.  Lymphadenopathy:     Cervical: No cervical adenopathy.  Skin:    General: Skin is warm and dry.     Findings: No rash.   Neurological:     Mental Status: He is alert and oriented to person, place, and time.     Coordination: Coordination normal.  Psychiatric:        Behavior: Behavior normal.       Assessment & Plan:   Problem List Items Addressed This Visit      Cardiovascular and Mediastinum   Hypertension   Relevant Medications   amLODipine (NORVASC) 5 MG tablet   Other Relevant Orders   CBC with Differential/Platelet   CMP14+EGFR     Endocrine   Hypothyroidism - Primary   Relevant Medications   levothyroxine (SYNTHROID) 75 MCG tablet   Other Relevant Orders   Thyroid Panel With TSH     Musculoskeletal and Integument   Gout of right foot   Relevant Medications   allopurinol (ZYLOPRIM) 300 MG tablet   Other Relevant Orders   Uric acid     Other   Hyperlipidemia   Relevant Medications   amLODipine (NORVASC) 5 MG tablet   Other Relevant Orders   Lipid panel    Other Visit Diagnoses    Essential hypertension       Relevant Medications   amLODipine (NORVASC) 5 MG tablet   Prostate cancer screening       Relevant Orders   PSA, total and free      Continue current medication, will check blood work.  He is get his tetanus shot today. Follow up plan: Return in about 1 year (around 03/16/2022), or if symptoms worsen or fail to improve, for Physical exam.  Counseling provided for all of the vaccine components No orders of the defined types were placed in this encounter.   Caryl Pina, MD [facility] 03/16/2021, 2:55 PM

## 2021-03-17 LAB — THYROID PANEL WITH TSH
Free Thyroxine Index: 2.1 (ref 1.2–4.9)
T3 Uptake Ratio: 29 % (ref 24–39)
T4, Total: 7.3 ug/dL (ref 4.5–12.0)
TSH: 2.94 u[IU]/mL (ref 0.450–4.500)

## 2021-03-17 LAB — CMP14+EGFR
ALT: 24 IU/L (ref 0–44)
AST: 21 IU/L (ref 0–40)
Albumin/Globulin Ratio: 1.3 (ref 1.2–2.2)
Albumin: 4.3 g/dL (ref 3.8–4.8)
Alkaline Phosphatase: 70 IU/L (ref 44–121)
BUN/Creatinine Ratio: 11 (ref 10–24)
BUN: 18 mg/dL (ref 8–27)
Bilirubin Total: 1.4 mg/dL — ABNORMAL HIGH (ref 0.0–1.2)
CO2: 23 mmol/L (ref 20–29)
Calcium: 9.7 mg/dL (ref 8.6–10.2)
Chloride: 102 mmol/L (ref 96–106)
Creatinine, Ser: 1.65 mg/dL — ABNORMAL HIGH (ref 0.76–1.27)
Globulin, Total: 3.3 g/dL (ref 1.5–4.5)
Glucose: 112 mg/dL — ABNORMAL HIGH (ref 65–99)
Potassium: 4.5 mmol/L (ref 3.5–5.2)
Sodium: 138 mmol/L (ref 134–144)
Total Protein: 7.6 g/dL (ref 6.0–8.5)
eGFR: 46 mL/min/{1.73_m2} — ABNORMAL LOW (ref >59)

## 2021-03-17 LAB — PSA, TOTAL AND FREE
PSA, Free Pct: 50 %
PSA, Free: 0.35 ng/mL
Prostate Specific Ag, Serum: 0.7 ng/mL (ref 0.0–4.0)

## 2021-03-17 LAB — LIPID PANEL
Chol/HDL Ratio: 6 ratio — ABNORMAL HIGH (ref 0.0–5.0)
Cholesterol, Total: 192 mg/dL (ref 100–199)
HDL: 32 mg/dL — ABNORMAL LOW (ref >39)
LDL Chol Calc (NIH): 77 mg/dL (ref 0–99)
Triglycerides: 521 mg/dL — ABNORMAL HIGH (ref 0–149)
VLDL Cholesterol Cal: 83 mg/dL — ABNORMAL HIGH (ref 5–40)

## 2021-03-17 LAB — CBC WITH DIFFERENTIAL/PLATELET
Basophils Absolute: 0.1 10*3/uL (ref 0.0–0.2)
Basos: 1 %
EOS (ABSOLUTE): 0.2 10*3/uL (ref 0.0–0.4)
Eos: 3 %
Hematocrit: 41.1 % (ref 37.5–51.0)
Hemoglobin: 14.4 g/dL (ref 13.0–17.7)
Immature Grans (Abs): 0 10*3/uL (ref 0.0–0.1)
Immature Granulocytes: 0 %
Lymphocytes Absolute: 2.2 10*3/uL (ref 0.7–3.1)
Lymphs: 35 %
MCH: 32.2 pg (ref 26.6–33.0)
MCHC: 35 g/dL (ref 31.5–35.7)
MCV: 92 fL (ref 79–97)
Monocytes Absolute: 0.5 10*3/uL (ref 0.1–0.9)
Monocytes: 7 %
Neutrophils Absolute: 3.3 10*3/uL (ref 1.4–7.0)
Neutrophils: 54 %
Platelets: 314 10*3/uL (ref 150–450)
RBC: 4.47 x10E6/uL (ref 4.14–5.80)
RDW: 13.5 % (ref 11.6–15.4)
WBC: 6.2 10*3/uL (ref 3.4–10.8)

## 2021-03-17 LAB — URIC ACID: Uric Acid: 5.9 mg/dL (ref 3.8–8.4)

## 2021-03-19 NOTE — Addendum Note (Signed)
Addended by: Dorene Sorrow on: 03/19/2021 08:50 AM   Modules accepted: Orders

## 2022-03-18 ENCOUNTER — Encounter: Payer: BC Managed Care – PPO | Admitting: Family Medicine

## 2022-03-28 ENCOUNTER — Other Ambulatory Visit: Payer: Self-pay | Admitting: Family Medicine

## 2022-03-28 DIAGNOSIS — I10 Essential (primary) hypertension: Secondary | ICD-10-CM

## 2022-04-02 ENCOUNTER — Telehealth: Payer: Self-pay | Admitting: Family Medicine

## 2022-04-02 DIAGNOSIS — I10 Essential (primary) hypertension: Secondary | ICD-10-CM

## 2022-04-02 MED ORDER — AMLODIPINE BESYLATE 5 MG PO TABS: 5.0000 mg | ORAL_TABLET | Freq: Every day | ORAL | 0 refills | Status: DC

## 2022-04-02 MED ORDER — ALLOPURINOL 300 MG PO TABS: 300.0000 mg | ORAL_TABLET | Freq: Every day | ORAL | 0 refills | Status: DC

## 2022-04-02 NOTE — Telephone Encounter (Signed)
Patient has appt 6/23 

## 2022-04-02 NOTE — Telephone Encounter (Signed)
30 day sent to last until appointment - wife aware

## 2022-04-02 NOTE — Telephone Encounter (Signed)
  Prescription Request  04/02/2022  Is this a "Controlled Substance" medicine? no  Have you seen your PCP in the last 2 weeks? no  If YES, route message to pool  -  If NO, patient needs to be scheduled for appointment.  What is the name of the medication or equipment? Allopurinol 300 mg, Amlodipine 5 mg  Have you contacted your pharmacy to request a refill? YES   [facility] [place]   Patient notified that their request is being sent to the clinical staff for review and that they should receive a response within 2 business days.

## 2022-04-25 ENCOUNTER — Other Ambulatory Visit: Payer: Self-pay | Admitting: Family Medicine

## 2022-04-25 DIAGNOSIS — I10 Essential (primary) hypertension: Secondary | ICD-10-CM

## 2022-04-26 ENCOUNTER — Encounter: Payer: Self-pay | Admitting: Family Medicine

## 2022-04-26 ENCOUNTER — Ambulatory Visit (INDEPENDENT_AMBULATORY_CARE_PROVIDER_SITE_OTHER): Payer: BC Managed Care – PPO | Admitting: Family Medicine

## 2022-04-26 VITALS — BP 130/75 | HR 78 | Temp 98.0°F | Ht 66.0 in | Wt 228.0 lb

## 2022-04-26 DIAGNOSIS — Z0000 Encounter for general adult medical examination without abnormal findings: Secondary | ICD-10-CM

## 2022-04-26 DIAGNOSIS — E039 Hypothyroidism, unspecified: Secondary | ICD-10-CM

## 2022-04-26 DIAGNOSIS — E782 Mixed hyperlipidemia: Secondary | ICD-10-CM

## 2022-04-26 DIAGNOSIS — Z125 Encounter for screening for malignant neoplasm of prostate: Secondary | ICD-10-CM

## 2022-04-26 DIAGNOSIS — Z0001 Encounter for general adult medical examination with abnormal findings: Secondary | ICD-10-CM | POA: Diagnosis not present

## 2022-04-26 DIAGNOSIS — I10 Essential (primary) hypertension: Secondary | ICD-10-CM

## 2022-04-26 DIAGNOSIS — M109 Gout, unspecified: Secondary | ICD-10-CM

## 2022-04-26 MED ORDER — ALLOPURINOL 300 MG PO TABS: 300.0000 mg | ORAL_TABLET | Freq: Every day | ORAL | 3 refills | Status: DC

## 2022-04-26 MED ORDER — LEVOTHYROXINE SODIUM 75 MCG PO TABS: 75.0000 ug | ORAL_TABLET | Freq: Every day | ORAL | 3 refills | Status: DC

## 2022-04-26 MED ORDER — AMLODIPINE BESYLATE 5 MG PO TABS: 5.0000 mg | ORAL_TABLET | Freq: Every day | ORAL | 3 refills | Status: DC

## 2022-04-27 LAB — CMP14+EGFR
ALT: 18 IU/L (ref 0–44)
AST: 19 IU/L (ref 0–40)
Albumin/Globulin Ratio: 1.4 (ref 1.2–2.2)
Albumin: 4.3 g/dL (ref 3.8–4.8)
Alkaline Phosphatase: 65 IU/L (ref 44–121)
BUN/Creatinine Ratio: 10 (ref 10–24)
BUN: 12 mg/dL (ref 8–27)
Bilirubin Total: 1.4 mg/dL — ABNORMAL HIGH (ref 0.0–1.2)
CO2: 24 mmol/L (ref 20–29)
Calcium: 9.3 mg/dL (ref 8.6–10.2)
Chloride: 104 mmol/L (ref 96–106)
Creatinine, Ser: 1.22 mg/dL (ref 0.76–1.27)
Globulin, Total: 3 g/dL (ref 1.5–4.5)
Glucose: 93 mg/dL (ref 70–99)
Potassium: 4.2 mmol/L (ref 3.5–5.2)
Sodium: 139 mmol/L (ref 134–144)
Total Protein: 7.3 g/dL (ref 6.0–8.5)
eGFR: 65 mL/min/{1.73_m2} (ref >59)

## 2022-04-27 LAB — CBC WITH DIFFERENTIAL/PLATELET
Basophils Absolute: 0.1 10*3/uL (ref 0.0–0.2)
Basos: 1 %
EOS (ABSOLUTE): 0.2 10*3/uL (ref 0.0–0.4)
Eos: 3 %
Hematocrit: 40.2 % (ref 37.5–51.0)
Hemoglobin: 14.4 g/dL (ref 13.0–17.7)
Immature Grans (Abs): 0 10*3/uL (ref 0.0–0.1)
Immature Granulocytes: 0 %
Lymphocytes Absolute: 2.1 10*3/uL (ref 0.7–3.1)
Lymphs: 38 %
MCH: 32.6 pg (ref 26.6–33.0)
MCHC: 35.8 g/dL — ABNORMAL HIGH (ref 31.5–35.7)
MCV: 91 fL (ref 79–97)
Monocytes Absolute: 0.4 10*3/uL (ref 0.1–0.9)
Monocytes: 8 %
Neutrophils Absolute: 2.8 10*3/uL (ref 1.4–7.0)
Neutrophils: 50 %
Platelets: 294 10*3/uL (ref 150–450)
RBC: 4.42 x10E6/uL (ref 4.14–5.80)
RDW: 13.1 % (ref 11.6–15.4)
WBC: 5.6 10*3/uL (ref 3.4–10.8)

## 2022-04-27 LAB — LIPID PANEL
Chol/HDL Ratio: 5.5 ratio — ABNORMAL HIGH (ref 0.0–5.0)
Cholesterol, Total: 177 mg/dL (ref 100–199)
HDL: 32 mg/dL — ABNORMAL LOW (ref >39)
LDL Chol Calc (NIH): 91 mg/dL (ref 0–99)
Triglycerides: 321 mg/dL — ABNORMAL HIGH (ref 0–149)
VLDL Cholesterol Cal: 54 mg/dL — ABNORMAL HIGH (ref 5–40)

## 2022-04-27 LAB — PSA, TOTAL AND FREE
PSA, Free Pct: 50 %
PSA, Free: 0.4 ng/mL
Prostate Specific Ag, Serum: 0.8 ng/mL (ref 0.0–4.0)

## 2022-04-27 LAB — URIC ACID: Uric Acid: 4.7 mg/dL (ref 3.8–8.4)

## 2022-04-27 LAB — TSH: TSH: 2.25 u[IU]/mL (ref 0.450–4.500)

## 2022-05-08 ENCOUNTER — Encounter: Payer: Self-pay | Admitting: Emergency Medicine

## 2023-02-28 ENCOUNTER — Ambulatory Visit (INDEPENDENT_AMBULATORY_CARE_PROVIDER_SITE_OTHER): Payer: BC Managed Care – PPO

## 2023-02-28 ENCOUNTER — Ambulatory Visit: Payer: BC Managed Care – PPO | Admitting: Family Medicine

## 2023-02-28 ENCOUNTER — Encounter: Payer: Self-pay | Admitting: Family Medicine

## 2023-02-28 VITALS — BP 129/75 | HR 73 | Temp 98.2°F | Ht 66.0 in | Wt 229.6 lb

## 2023-02-28 DIAGNOSIS — M79671 Pain in right foot: Secondary | ICD-10-CM | POA: Diagnosis not present

## 2023-02-28 DIAGNOSIS — R2241 Localized swelling, mass and lump, right lower limb: Secondary | ICD-10-CM

## 2023-02-28 DIAGNOSIS — M109 Gout, unspecified: Secondary | ICD-10-CM | POA: Insufficient documentation

## 2023-02-28 MED ORDER — PREDNISONE 20 MG PO TABS
40.0000 mg | ORAL_TABLET | Freq: Every day | ORAL | 0 refills | Status: AC
Start: 2023-02-28 — End: 2023-03-05

## 2023-02-28 NOTE — Progress Notes (Signed)
Subjective:  Patient ID: Don Nelson, male    DOB: [birth year], 67 y.o.   MRN: [id number]  Patient Care Team: Dettinger, Elige Radon, MD as PCP - General (Family Medicine)   Chief Complaint:  Foot Pain (Right foot pain and swelling x 1 week )   HPI: Don Nelson is a 67 y.o. male presenting on 02/28/2023 for Foot Pain (Right foot pain and swelling x 1 week )  Foot pain and swelling for 1 week, no known injury.   Foot Pain This is a new problem. The current episode started in the past 7 days. The problem occurs constantly. The problem has been waxing and waning. Associated symptoms include arthralgias. Pertinent negatives include no abdominal pain, anorexia, change in bowel habit, chest pain, chills, congestion, coughing, diaphoresis, fatigue, fever, headaches, joint swelling, myalgias, nausea, neck pain, numbness, rash, sore throat, swollen glands, urinary symptoms, vertigo, visual change, vomiting or weakness. The symptoms are aggravated by walking. He has tried rest for the symptoms. The treatment provided mild relief.     Relevant past medical, surgical, family, and social history reviewed and updated as indicated.  Allergies and medications reviewed and updated. Data reviewed: Chart in Epic.   Past Medical History:  Diagnosis Date   Bronchitis    Gout    Hypothyroidism    Rhinitis     Past Surgical History:  Procedure Laterality Date   TRIGGER FINGER RELEASE      Social History   Socioeconomic History   Marital status: Married    Spouse name: Not on file   Number of children: Not on file   Years of education: Not on file   Highest education level: Not on file  Occupational History   Not on file  Tobacco Use   Smoking status: Former    Types: Cigarettes    Quit date: 11/04/1994    Years since quitting: 28.3   Smokeless tobacco: Never  Substance and Sexual Activity   Alcohol use: No   Drug use: No   Sexual activity: Not on file  Other Topics Concern   Not  on file  Social History Narrative   Not on file   Social Determinants of Health   Financial Resource Strain: Not on file  Food Insecurity: Not on file  Transportation Needs: Not on file  Physical Activity: Not on file  Stress: Not on file  Social Connections: Not on file  Intimate Partner Violence: Not on file    Outpatient Encounter Medications as of 02/28/2023  Medication Sig   allopurinol (ZYLOPRIM) 300 MG tablet Take 1 tablet (300 mg total) by mouth daily.   amLODipine (NORVASC) 5 MG tablet Take 1 tablet (5 mg total) by mouth daily.   levothyroxine (SYNTHROID) 75 MCG tablet Take 1 tablet (75 mcg total) by mouth daily before breakfast.   predniSONE (DELTASONE) 20 MG tablet Take 2 tablets (40 mg total) by mouth daily with breakfast for 5 days. 2 po daily for 5 days   indomethacin (INDOCIN SR) 75 MG CR capsule TAKE 1 CAPSULE BY MOUTH EVERY 8 TO 12 HOURS AS NEEDED (Patient not taking: Reported on 02/28/2023)   No facility-administered encounter medications on file as of 02/28/2023.    No Known Allergies  Review of Systems  Constitutional:  Negative for activity change, appetite change, chills, diaphoresis, fatigue, fever and unexpected weight change.  HENT:  Negative for congestion and sore throat.   Respiratory:  Negative for cough.   Cardiovascular:  Negative for  chest pain.  Gastrointestinal:  Negative for abdominal pain, anorexia, change in bowel habit, nausea and vomiting.  Genitourinary:  Negative for decreased urine volume and difficulty urinating.  Musculoskeletal:  Positive for arthralgias. Negative for back pain, gait problem, joint swelling, myalgias, neck pain and neck stiffness.       Foot swelling  Skin:  Negative for color change, pallor, rash and wound.  Neurological:  Negative for vertigo, weakness, numbness and headaches.  All other systems reviewed and are negative.       Objective:  BP 129/75   Pulse 73   Temp 98.2 F (36.8 C) (Temporal)   Ht 5\' 6"   (1.676 m)   Wt 229 lb 9.6 oz (104.1 kg)   SpO2 94%   BMI 37.06 kg/m    Wt Readings from Last 3 Encounters:  02/28/23 229 lb 9.6 oz (104.1 kg)  04/26/22 228 lb (103.4 kg)  03/16/21 230 lb (104.3 kg)    Physical Exam Vitals and nursing note reviewed.  Constitutional:      Appearance: Normal appearance. He is obese.  HENT:     Head: Normocephalic and atraumatic.     Mouth/Throat:     Mouth: Mucous membranes are moist.  Eyes:     Conjunctiva/sclera: Conjunctivae normal.     Pupils: Pupils are equal, round, and reactive to light.  Cardiovascular:     Rate and Rhythm: Normal rate and regular rhythm.     Pulses: Normal pulses.          Dorsalis pedis pulses are 2+ on the right side.       Posterior tibial pulses are 2+ on the right side.     Heart sounds: Normal heart sounds.  Pulmonary:     Effort: Pulmonary effort is normal.  Musculoskeletal:     Cervical back: Neck supple.     Right lower leg: No edema.     Left lower leg: No edema.       Feet:  Skin:    General: Skin is warm and dry.     Capillary Refill: Capillary refill takes less than 2 seconds.  Neurological:     General: No focal deficit present.     Mental Status: He is alert and oriented to person, place, and time.  Psychiatric:        Mood and Affect: Mood normal.        Behavior: Behavior normal.        Thought Content: Thought content normal.        Judgment: Judgment normal.     Results for orders placed or performed in visit on 04/26/22  CMP14+EGFR  Result Value Ref Range   Glucose 93 70 - 99 mg/dL   BUN 12 8 - 27 mg/dL   Creatinine, Ser 1.61 0.76 - 1.27 mg/dL   eGFR 65 >09 UE/AVW/0.98   BUN/Creatinine Ratio 10 10 - 24   Sodium 139 134 - 144 mmol/L   Potassium 4.2 3.5 - 5.2 mmol/L   Chloride 104 96 - 106 mmol/L   CO2 24 20 - 29 mmol/L   Calcium 9.3 8.6 - 10.2 mg/dL   Total Protein 7.3 6.0 - 8.5 g/dL   Albumin 4.3 3.8 - 4.8 g/dL   Globulin, Total 3.0 1.5 - 4.5 g/dL   Albumin/Globulin Ratio  1.4 1.2 - 2.2   Bilirubin Total 1.4 (H) 0.0 - 1.2 mg/dL   Alkaline Phosphatase 65 44 - 121 IU/L   AST 19 0 - 40 IU/L  ALT 18 0 - 44 IU/L  Lipid panel  Result Value Ref Range   Cholesterol, Total 177 100 - 199 mg/dL   Triglycerides 409 (H) 0 - 149 mg/dL   HDL 32 (L) >81 mg/dL   VLDL Cholesterol Cal 54 (H) 5 - 40 mg/dL   LDL Chol Calc (NIH) 91 0 - 99 mg/dL   Chol/HDL Ratio 5.5 (H) 0.0 - 5.0 ratio  CBC with Differential/Platelet  Result Value Ref Range   WBC 5.6 3.4 - 10.8 x10E3/uL   RBC 4.42 4.14 - 5.80 x10E6/uL   Hemoglobin 14.4 13.0 - 17.7 g/dL   Hematocrit 19.1 47.8 - 51.0 %   MCV 91 79 - 97 fL   MCH 32.6 26.6 - 33.0 pg   MCHC 35.8 (H) 31.5 - 35.7 g/dL   RDW 29.5 62.1 - 30.8 %   Platelets 294 150 - 450 x10E3/uL   Neutrophils 50 Not Estab. %   Lymphs 38 Not Estab. %   Monocytes 8 Not Estab. %   Eos 3 Not Estab. %   Basos 1 Not Estab. %   Neutrophils Absolute 2.8 1.4 - 7.0 x10E3/uL   Lymphocytes Absolute 2.1 0.7 - 3.1 x10E3/uL   Monocytes Absolute 0.4 0.1 - 0.9 x10E3/uL   EOS (ABSOLUTE) 0.2 0.0 - 0.4 x10E3/uL   Basophils Absolute 0.1 0.0 - 0.2 x10E3/uL   Immature Granulocytes 0 Not Estab. %   Immature Grans (Abs) 0.0 0.0 - 0.1 x10E3/uL  PSA, total and free  Result Value Ref Range   Prostate Specific Ag, Serum 0.8 0.0 - 4.0 ng/mL   PSA, Free 0.40 N/A ng/mL   PSA, Free Pct 50.0 %  Uric acid  Result Value Ref Range   Uric Acid 4.7 3.8 - 8.4 mg/dL  TSH  Result Value Ref Range   TSH 2.250 0.450 - 4.500 uIU/mL       Pertinent labs & imaging results that were available during my care of the patient were reviewed by me and considered in my medical decision making.  Assessment & Plan:  Don Nelson was seen today for foot pain.  Diagnoses and all orders for this visit:  Right foot pain Localized swelling of right foot Imaging without acute findings. Does reveal degenerative changes and bone spurs. No erythema or ecchymosis. Offered post-op shoe, pt declined. Aware to make  follow up with Dr. Ulice Brilliant. Will notify pt if radiology reading differs.  -     DG Foot Complete Right; Future -     predniSONE (DELTASONE) 20 MG tablet; Take 2 tablets (40 mg total) by mouth daily with breakfast for 5 days. 2 po daily for 5 days    Continue all other maintenance medications.  Follow up plan: Return if symptoms worsen or fail to improve.   Continue healthy lifestyle choices, including diet (rich in fruits, vegetables, and lean proteins, and low in salt and simple carbohydrates) and exercise (at least 30 minutes of moderate physical activity daily).  Educational handout given for foot pain  The above assessment and management plan was discussed with the patient. The patient verbalized understanding of and has agreed to the management plan. Patient is aware to call the clinic if they develop any new symptoms or if symptoms persist or worsen. Patient is aware when to return to the clinic for a follow-up visit. Patient educated on when it is appropriate to go to the emergency department.   Kari Baars, FNP-C [facility] [phone]

## 2023-03-06 NOTE — Progress Notes (Signed)
Patient returning call. Please leave message with results if he does not answer.

## 2023-03-07 ENCOUNTER — Telehealth: Payer: Self-pay | Admitting: Family Medicine

## 2023-03-07 NOTE — Telephone Encounter (Signed)
Pt has been informed of results and understood. Results have been mailed to pt as well.

## 2023-03-07 NOTE — Telephone Encounter (Signed)
Please call patient to go over xray results.

## 2023-04-28 ENCOUNTER — Encounter: Payer: BC Managed Care – PPO | Admitting: Family Medicine

## 2023-05-15 ENCOUNTER — Encounter: Payer: BC Managed Care – PPO | Admitting: Family Medicine

## 2023-05-25 ENCOUNTER — Other Ambulatory Visit: Payer: Self-pay | Admitting: Family Medicine

## 2023-05-25 DIAGNOSIS — I10 Essential (primary) hypertension: Secondary | ICD-10-CM

## 2023-05-26 ENCOUNTER — Other Ambulatory Visit: Payer: Self-pay | Admitting: Family Medicine

## 2023-05-27 NOTE — Telephone Encounter (Signed)
Patient needs a follow up scheduled before refills can be given.

## 2023-05-28 ENCOUNTER — Other Ambulatory Visit: Payer: Self-pay | Admitting: *Deleted

## 2023-05-28 ENCOUNTER — Telehealth: Payer: Self-pay | Admitting: Family Medicine

## 2023-05-28 DIAGNOSIS — I10 Essential (primary) hypertension: Secondary | ICD-10-CM

## 2023-05-28 MED ORDER — ALLOPURINOL 300 MG PO TABS: 300.0000 mg | ORAL_TABLET | Freq: Every day | ORAL | 3 refills | Status: DC

## 2023-05-28 MED ORDER — AMLODIPINE BESYLATE 5 MG PO TABS
5.0000 mg | ORAL_TABLET | Freq: Every day | ORAL | 3 refills | Status: DC
Start: 2023-05-28 — End: 2023-07-24

## 2023-05-28 NOTE — Telephone Encounter (Signed)
REFILLED AS REQUESTED ?

## 2023-05-28 NOTE — Telephone Encounter (Signed)
Left message for pt to call back to schedule in person or video visit with Dr Dettinger.

## 2023-05-28 NOTE — Telephone Encounter (Signed)
  Prescription Request  05/28/2023  Is this a "Controlled Substance" medicine? NO  Have you seen your PCP in the last 2 weeks? NO PT has appt on 07/24/23  If YES, route message to pool  -  If NO, patient needs to be scheduled for appointment.  What is the name of the medication or equipment? allopurinol (ZYLOPRIM) 300 MG tablet  amLODipine (NORVASC) 5 MG tablet   Have you contacted your pharmacy to request a refill? yes   Which pharmacy would you like this sent to? [facility]    Patient notified that their request is being sent to the clinical staff for review and that they should receive a response within 2 business days.

## 2023-06-23 ENCOUNTER — Other Ambulatory Visit: Payer: Self-pay | Admitting: Family Medicine

## 2023-07-24 ENCOUNTER — Ambulatory Visit (INDEPENDENT_AMBULATORY_CARE_PROVIDER_SITE_OTHER): Payer: BC Managed Care – PPO

## 2023-07-24 ENCOUNTER — Encounter: Payer: Self-pay | Admitting: Family Medicine

## 2023-07-24 ENCOUNTER — Ambulatory Visit: Payer: BC Managed Care – PPO | Admitting: Family Medicine

## 2023-07-24 VITALS — BP 116/70 | HR 56 | Temp 98.0°F | Ht 66.0 in | Wt 226.2 lb

## 2023-07-24 DIAGNOSIS — E039 Hypothyroidism, unspecified: Secondary | ICD-10-CM

## 2023-07-24 DIAGNOSIS — Z0001 Encounter for general adult medical examination with abnormal findings: Secondary | ICD-10-CM

## 2023-07-24 DIAGNOSIS — M19011 Primary osteoarthritis, right shoulder: Secondary | ICD-10-CM

## 2023-07-24 DIAGNOSIS — N529 Male erectile dysfunction, unspecified: Secondary | ICD-10-CM

## 2023-07-24 DIAGNOSIS — E782 Mixed hyperlipidemia: Secondary | ICD-10-CM

## 2023-07-24 DIAGNOSIS — I10 Essential (primary) hypertension: Secondary | ICD-10-CM

## 2023-07-24 DIAGNOSIS — Z125 Encounter for screening for malignant neoplasm of prostate: Secondary | ICD-10-CM

## 2023-07-24 DIAGNOSIS — Z0000 Encounter for general adult medical examination without abnormal findings: Secondary | ICD-10-CM

## 2023-07-24 MED ORDER — AMLODIPINE BESYLATE 5 MG PO TABS
5.0000 mg | ORAL_TABLET | Freq: Every day | ORAL | 3 refills | Status: DC
Start: 2023-07-24 — End: 2023-11-12

## 2023-07-24 MED ORDER — SILDENAFIL CITRATE 20 MG PO TABS
20.0000 mg | ORAL_TABLET | ORAL | 1 refills | Status: DC | PRN
Start: 2023-07-24 — End: 2024-07-14

## 2023-07-24 MED ORDER — LEVOTHYROXINE SODIUM 75 MCG PO TABS
75.0000 ug | ORAL_TABLET | Freq: Every day | ORAL | 3 refills | Status: DC
Start: 2023-07-24 — End: 2024-05-05

## 2023-07-24 MED ORDER — INDOMETHACIN ER 75 MG PO CPCR
ORAL_CAPSULE | ORAL | 2 refills | Status: DC
Start: 2023-07-24 — End: 2024-07-14

## 2023-07-24 MED ORDER — ALLOPURINOL 300 MG PO TABS
300.0000 mg | ORAL_TABLET | Freq: Every day | ORAL | 3 refills | Status: DC
Start: 2023-07-24 — End: 2024-07-14

## 2023-07-24 NOTE — Progress Notes (Signed)
BP 116/70   Pulse (!) 56   Temp 98 F (36.7 C) (Temporal)   Ht 5\' 6"  (1.676 m)   Wt 226 lb 3.2 oz (102.6 kg)   SpO2 97%   BMI 36.51 kg/m    Subjective:   Patient ID: Don Nelson, male    DOB: Mar 31, 1956, 67 y.o.   MRN: [id number]  HPI: Don Nelson is a 67 y.o. male presenting on 07/24/2023 for Medication Refill   HPI Physical exam Patient denies any chest pain, shortness of breath, headaches or vision issues, abdominal complaints, diarrhea, nausea, vomiting, or joint issues.  His father just passed away last night.  Family seems to be doing okay with that.  They are going to have a service.  The only issue he has is sometimes he has difficulty maintaining erections, he can get an erection but it does not last as long as he has a younger wife and wants to discuss possibilities for that.  He also has been having some right shoulder popping and grinding and some pain with overhead range of motion, and stiffer when he starts moving and then he gets along better symptoms more.  Hypertension Patient is currently on amlodipine, and their blood pressure today is 116/70. Patient denies any lightheadedness or dizziness. Patient denies headaches, blurred vision, chest pains, shortness of breath, or weakness. Denies any side effects from medication and is content with current medication.   Hypothyroidism recheck Patient is coming in for thyroid recheck today as well. They deny any issues with hair changes or heat or cold problems or diarrhea or constipation. They deny any chest pain or palpitations. They are currently on levothyroxine 75 micrograms   Hyperlipidemia Patient is coming in for recheck of his hyperlipidemia. The patient is currently taking no medicine currently. They deny any issues with myalgias or history of liver damage from it. They deny any focal numbness or weakness or chest pain.   Relevant past medical, surgical, family and social history reviewed and updated as  indicated. Interim medical history since our last visit reviewed. Allergies and medications reviewed and updated.  Review of Systems  Constitutional:  Negative for chills and fever.  HENT:  Negative for ear pain and tinnitus.   Eyes:  Negative for pain and discharge.  Respiratory:  Negative for cough, shortness of breath and wheezing.   Cardiovascular:  Negative for chest pain, palpitations and leg swelling.  Gastrointestinal:  Negative for abdominal pain, blood in stool, constipation and diarrhea.  Genitourinary:  Negative for dysuria and hematuria.  Musculoskeletal:  Negative for back pain, gait problem and myalgias.  Skin:  Negative for rash.  Neurological:  Negative for dizziness, weakness and headaches.  Psychiatric/Behavioral:  Negative for suicidal ideas.   All other systems reviewed and are negative.   Per HPI unless specifically indicated above   Allergies as of 07/24/2023   No Known Allergies      Medication List        Accurate as of July 24, 2023  3:48 PM. If you have any questions, ask your nurse or doctor.          allopurinol 300 MG tablet Commonly known as: ZYLOPRIM Take 1 tablet (300 mg total) by mouth daily.   amLODipine 5 MG tablet Commonly known as: NORVASC Take 1 tablet (5 mg total) by mouth daily.   indomethacin 75 MG CR capsule Commonly known as: INDOCIN SR TAKE 1 CAPSULE BY MOUTH EVERY 8 TO 12 HOURS AS NEEDED  levothyroxine 75 MCG tablet Commonly known as: SYNTHROID Take 1 tablet (75 mcg total) by mouth daily before breakfast. What changed: additional instructions Changed by: Elige Radon Naina Sleeper   sildenafil 20 MG tablet Commonly known as: REVATIO Take 1-5 tablets (20-100 mg total) by mouth as needed. Started by: Elige Radon Clarene Curran         Objective:   BP 116/70   Pulse (!) 56   Temp 98 F (36.7 C) (Temporal)   Ht 5\' 6"  (1.676 m)   Wt 226 lb 3.2 oz (102.6 kg)   SpO2 97%   BMI 36.51 kg/m   Wt Readings from Last 3  Encounters:  07/24/23 226 lb 3.2 oz (102.6 kg)  02/28/23 229 lb 9.6 oz (104.1 kg)  04/26/22 228 lb (103.4 kg)    Physical Exam Vitals reviewed.  Constitutional:      General: He is not in acute distress.    Appearance: He is well-developed. He is not diaphoretic.  HENT:     Right Ear: External ear normal.     Left Ear: External ear normal.     Nose: Nose normal.     Mouth/Throat:     Pharynx: No oropharyngeal exudate.  Eyes:     General: No scleral icterus.       Right eye: No discharge.     Conjunctiva/sclera: Conjunctivae normal.     Pupils: Pupils are equal, round, and reactive to light.  Neck:     Thyroid: No thyromegaly.  Cardiovascular:     Rate and Rhythm: Normal rate and regular rhythm.     Heart sounds: Normal heart sounds. No murmur heard. Pulmonary:     Effort: Pulmonary effort is normal. No respiratory distress.     Breath sounds: Normal breath sounds. No wheezing.  Abdominal:     General: Bowel sounds are normal. There is no distension.     Palpations: Abdomen is soft.     Tenderness: There is no abdominal tenderness. There is no guarding or rebound.  Musculoskeletal:        General: Normal range of motion.     Right shoulder: Tenderness and crepitus present. No swelling or deformity. Normal range of motion. Normal strength. Normal pulse.     Cervical back: Neck supple.  Lymphadenopathy:     Cervical: No cervical adenopathy.  Skin:    General: Skin is warm and dry.     Findings: No rash.  Neurological:     Mental Status: He is alert and oriented to person, place, and time.     Coordination: Coordination normal.  Psychiatric:        Behavior: Behavior normal.     Results for orders placed or performed in visit on 04/26/22  CMP14+EGFR  Result Value Ref Range   Glucose 93 70 - 99 mg/dL   BUN 12 8 - 27 mg/dL   Creatinine, Ser 0.45 0.76 - 1.27 mg/dL   eGFR 65 >40 JW/JXB/1.47   BUN/Creatinine Ratio 10 10 - 24   Sodium 139 134 - 144 mmol/L   Potassium  4.2 3.5 - 5.2 mmol/L   Chloride 104 96 - 106 mmol/L   CO2 24 20 - 29 mmol/L   Calcium 9.3 8.6 - 10.2 mg/dL   Total Protein 7.3 6.0 - 8.5 g/dL   Albumin 4.3 3.8 - 4.8 g/dL   Globulin, Total 3.0 1.5 - 4.5 g/dL   Albumin/Globulin Ratio 1.4 1.2 - 2.2   Bilirubin Total 1.4 (H) 0.0 - 1.2 mg/dL  Alkaline Phosphatase 65 44 - 121 IU/L   AST 19 0 - 40 IU/L   ALT 18 0 - 44 IU/L  Lipid panel  Result Value Ref Range   Cholesterol, Total 177 100 - 199 mg/dL   Triglycerides 829 (H) 0 - 149 mg/dL   HDL 32 (L) >56 mg/dL   VLDL Cholesterol Cal 54 (H) 5 - 40 mg/dL   LDL Chol Calc (NIH) 91 0 - 99 mg/dL   Chol/HDL Ratio 5.5 (H) 0.0 - 5.0 ratio  CBC with Differential/Platelet  Result Value Ref Range   WBC 5.6 3.4 - 10.8 x10E3/uL   RBC 4.42 4.14 - 5.80 x10E6/uL   Hemoglobin 14.4 13.0 - 17.7 g/dL   Hematocrit 21.3 08.6 - 51.0 %   MCV 91 79 - 97 fL   MCH 32.6 26.6 - 33.0 pg   MCHC 35.8 (H) 31.5 - 35.7 g/dL   RDW 57.8 46.9 - 62.9 %   Platelets 294 150 - 450 x10E3/uL   Neutrophils 50 Not Estab. %   Lymphs 38 Not Estab. %   Monocytes 8 Not Estab. %   Eos 3 Not Estab. %   Basos 1 Not Estab. %   Neutrophils Absolute 2.8 1.4 - 7.0 x10E3/uL   Lymphocytes Absolute 2.1 0.7 - 3.1 x10E3/uL   Monocytes Absolute 0.4 0.1 - 0.9 x10E3/uL   EOS (ABSOLUTE) 0.2 0.0 - 0.4 x10E3/uL   Basophils Absolute 0.1 0.0 - 0.2 x10E3/uL   Immature Granulocytes 0 Not Estab. %   Immature Grans (Abs) 0.0 0.0 - 0.1 x10E3/uL  PSA, total and free  Result Value Ref Range   Prostate Specific Ag, Serum 0.8 0.0 - 4.0 ng/mL   PSA, Free 0.40 N/A ng/mL   PSA, Free Pct 50.0 %  Uric acid  Result Value Ref Range   Uric Acid 4.7 3.8 - 8.4 mg/dL  TSH  Result Value Ref Range   TSH 2.250 0.450 - 4.500 uIU/mL    Assessment & Plan:   Problem List Items Addressed This Visit       Cardiovascular and Mediastinum   Hypertension   Relevant Medications   amLODipine (NORVASC) 5 MG tablet   sildenafil (REVATIO) 20 MG tablet   Other  Relevant Orders   CBC with Differential/Platelet   CMP14+EGFR     Endocrine   Hypothyroidism   Relevant Medications   levothyroxine (SYNTHROID) 75 MCG tablet   Other Relevant Orders   TSH     Other   Hyperlipidemia   Relevant Medications   amLODipine (NORVASC) 5 MG tablet   sildenafil (REVATIO) 20 MG tablet   Other Relevant Orders   Lipid panel   Other Visit Diagnoses     Physical exam    -  Primary   Relevant Medications   allopurinol (ZYLOPRIM) 300 MG tablet   amLODipine (NORVASC) 5 MG tablet   indomethacin (INDOCIN SR) 75 MG CR capsule   levothyroxine (SYNTHROID) 75 MCG tablet   Other Relevant Orders   CBC with Differential/Platelet   CMP14+EGFR   Lipid panel   TSH   PSA, total and free   Uric acid   Prostate cancer screening       Relevant Orders   PSA, total and free   Essential hypertension       Relevant Medications   amLODipine (NORVASC) 5 MG tablet   sildenafil (REVATIO) 20 MG tablet   Erectile dysfunction, unspecified erectile dysfunction type       Relevant Medications   sildenafil (  REVATIO) 20 MG tablet   Primary osteoarthritis of right shoulder       Relevant Medications   allopurinol (ZYLOPRIM) 300 MG tablet   indomethacin (INDOCIN SR) 75 MG CR capsule   Other Relevant Orders   DG Shoulder Right       Right shoulder x-ray, await final read from radiology.  Will do blood work today. Follow up plan: Return in about 1 year (around 07/23/2024), or if symptoms worsen or fail to improve, for Physical and thyroid and hypertension and cholesterol recheck.  Counseling provided for all of the vaccine components Orders Placed This Encounter  Procedures   DG Shoulder Right   CBC with Differential/Platelet   CMP14+EGFR   Lipid panel   TSH   PSA, total and free   Uric acid    Arville Care, MD [place] 07/24/2023, 3:48 PM

## 2023-07-25 LAB — CBC WITH DIFFERENTIAL/PLATELET
Basophils Absolute: 0.1 10*3/uL (ref 0.0–0.2)
Basos: 1 %
EOS (ABSOLUTE): 0.2 10*3/uL (ref 0.0–0.4)
Eos: 3 %
Hematocrit: 45.6 % (ref 37.5–51.0)
Hemoglobin: 15.3 g/dL (ref 13.0–17.7)
Immature Grans (Abs): 0 10*3/uL (ref 0.0–0.1)
Immature Granulocytes: 0 %
Lymphocytes Absolute: 2.6 10*3/uL (ref 0.7–3.1)
Lymphs: 41 %
MCH: 31.9 pg (ref 26.6–33.0)
MCHC: 33.6 g/dL (ref 31.5–35.7)
MCV: 95 fL (ref 79–97)
Monocytes Absolute: 0.5 10*3/uL (ref 0.1–0.9)
Monocytes: 8 %
Neutrophils Absolute: 3 10*3/uL (ref 1.4–7.0)
Neutrophils: 47 %
Platelets: 297 10*3/uL (ref 150–450)
RBC: 4.79 x10E6/uL (ref 4.14–5.80)
RDW: 12.9 % (ref 11.6–15.4)
WBC: 6.5 10*3/uL (ref 3.4–10.8)

## 2023-07-25 LAB — PSA, TOTAL AND FREE
PSA, Free Pct: 52.9 %
PSA, Free: 0.37 ng/mL
Prostate Specific Ag, Serum: 0.7 ng/mL (ref 0.0–4.0)

## 2023-07-25 LAB — CMP14+EGFR
ALT: 25 IU/L (ref 0–44)
AST: 24 IU/L (ref 0–40)
Albumin: 4 g/dL (ref 3.9–4.9)
Alkaline Phosphatase: 64 IU/L (ref 44–121)
BUN/Creatinine Ratio: 22 (ref 10–24)
BUN: 24 mg/dL (ref 8–27)
Bilirubin Total: 2 mg/dL — ABNORMAL HIGH (ref 0.0–1.2)
CO2: 19 mmol/L — ABNORMAL LOW (ref 20–29)
Calcium: 9.5 mg/dL (ref 8.6–10.2)
Chloride: 104 mmol/L (ref 96–106)
Creatinine, Ser: 1.11 mg/dL (ref 0.76–1.27)
Globulin, Total: 3.1 g/dL (ref 1.5–4.5)
Glucose: 93 mg/dL (ref 70–99)
Potassium: 4.1 mmol/L (ref 3.5–5.2)
Sodium: 141 mmol/L (ref 134–144)
Total Protein: 7.1 g/dL (ref 6.0–8.5)
eGFR: 73 mL/min/{1.73_m2} (ref >59)

## 2023-07-25 LAB — LIPID PANEL
Chol/HDL Ratio: 6.4 ratio — ABNORMAL HIGH (ref 0.0–5.0)
Cholesterol, Total: 179 mg/dL (ref 100–199)
HDL: 28 mg/dL — ABNORMAL LOW (ref >39)
LDL Chol Calc (NIH): 95 mg/dL (ref 0–99)
Triglycerides: 334 mg/dL — ABNORMAL HIGH (ref 0–149)
VLDL Cholesterol Cal: 56 mg/dL — ABNORMAL HIGH (ref 5–40)

## 2023-07-25 LAB — URIC ACID: Uric Acid: 4.5 mg/dL (ref 3.8–8.4)

## 2023-07-25 LAB — TSH: TSH: 2.77 u[IU]/mL (ref 0.450–4.500)

## 2023-07-30 ENCOUNTER — Other Ambulatory Visit: Payer: Self-pay

## 2023-07-30 MED ORDER — FENOFIBRATE 48 MG PO TABS: 48.0000 mg | ORAL_TABLET | Freq: Every day | ORAL | 1 refills | Status: DC

## 2023-08-06 ENCOUNTER — Telehealth: Payer: Self-pay | Admitting: Family Medicine

## 2023-08-06 NOTE — Telephone Encounter (Signed)
Pt made aware. Melissa has not heard anything from radiology either.

## 2023-08-06 NOTE — Telephone Encounter (Signed)
Patient's wife calling to check on the status of the x ray that he had on 07/24/23. Please review and call back.

## 2023-08-06 NOTE — Telephone Encounter (Signed)
Looks like report has still not been read by radiology. Will ask our xray tech for any advise and make Dettinger aware.

## 2023-08-06 NOTE — Telephone Encounter (Signed)
I agree that it is pretty crazy that it has not been read yet, yes please send a message to Don Nelson so she can send a message to radiology to check on the status.  Based on what I can see on the x-ray I do not see anything that arthritis but I would like to wait for radiology read for sure

## 2023-08-13 ENCOUNTER — Other Ambulatory Visit: Payer: Self-pay

## 2023-08-13 DIAGNOSIS — M19011 Primary osteoarthritis, right shoulder: Secondary | ICD-10-CM

## 2023-11-12 ENCOUNTER — Telehealth: Payer: Self-pay

## 2023-11-12 ENCOUNTER — Other Ambulatory Visit: Payer: Self-pay | Admitting: Family Medicine

## 2023-11-12 DIAGNOSIS — Z0000 Encounter for general adult medical examination without abnormal findings: Secondary | ICD-10-CM

## 2023-11-12 DIAGNOSIS — I10 Essential (primary) hypertension: Secondary | ICD-10-CM

## 2023-11-12 MED ORDER — AMLODIPINE BESYLATE 5 MG PO TABS: 5.0000 mg | ORAL_TABLET | Freq: Every day | ORAL | 0 refills | Status: DC

## 2023-11-12 NOTE — Telephone Encounter (Signed)
 Pt needs refills of 5mg  amlodipine. States that in his last bottle he had two different sizes. He thought may be something had changes as far as dosage and he combined the meds or mistake at pharmacy. He is unsure. [facility]m supply [facility] to [facility]. Pt aware.

## 2023-11-12 NOTE — Telephone Encounter (Signed)
 Copied from CRM [id number]. Topic: Clinical - Medication Refill >> Nov 12, 2023  3:32 PM Carlatta H wrote: Most Recent Primary Care Visit:  Provider: MARYANNE CHEW A  Department: [facility]  Visit Type: OFFICE VISIT  Date: 07/24/2023  Medication: amLODipine  (NORVASC ) 5 MG tablet [[id number]]  Has the patient contacted their pharmacy? Yes (Agent: If no, request that the patient contact the pharmacy for the refill. If patient does not wish to contact the pharmacy document the reason why and proceed with request.) (Agent: If yes, when and what did the pharmacy advise?)  Is this the correct pharmacy for this prescription? Yes If no, delete pharmacy and type the correct one.  This is the patient's preferred pharmacy:  [id number] - [place], [place] - [address] [place] [address] Phone: [phone] Fax: [phone]   Has the prescription been filled recently? No  Is the patient out of the medication? Yes  Has the patient been seen for an appointment in the last year OR does the patient have an upcoming appointment? Yes  Can we respond through MyChart? Yes  Agent: Please be advised that Rx refills may take up to 3 business days. We ask that you follow-up with your pharmacy.

## 2023-11-12 NOTE — Telephone Encounter (Signed)
 Copied from CRM [id number]. Topic: Clinical - Prescription Issue >> Nov 12, 2023  3:30 PM Carlatta H wrote: Reason for CRM: Patient wants a call back regarding his last refill prescription//He stated they were 2 different sizes//Please call patient about medication

## 2023-11-13 MED ORDER — AMLODIPINE BESYLATE 5 MG PO TABS
5.0000 mg | ORAL_TABLET | Freq: Every day | ORAL | 2 refills | Status: DC
Start: 2023-11-13 — End: 2024-07-14

## 2024-01-25 ENCOUNTER — Other Ambulatory Visit: Payer: Self-pay | Admitting: Family Medicine

## 2024-05-05 ENCOUNTER — Other Ambulatory Visit: Payer: Self-pay | Admitting: Family Medicine

## 2024-05-05 DIAGNOSIS — Z0000 Encounter for general adult medical examination without abnormal findings: Secondary | ICD-10-CM

## 2024-05-28 ENCOUNTER — Other Ambulatory Visit: Payer: Self-pay | Admitting: Family Medicine

## 2024-05-28 DIAGNOSIS — Z0000 Encounter for general adult medical examination without abnormal findings: Secondary | ICD-10-CM

## 2024-05-31 ENCOUNTER — Encounter: Payer: Self-pay | Admitting: Family Medicine

## 2024-05-31 NOTE — Telephone Encounter (Signed)
 Called to schedule appt no answer no voicemail Letter mailed

## 2024-05-31 NOTE — Telephone Encounter (Signed)
 Dettinger NTBS last PE & TSH 07/24/23 RF ws sent on 05/05/24

## 2024-06-09 ENCOUNTER — Telehealth: Payer: Self-pay | Admitting: Family Medicine

## 2024-06-09 DIAGNOSIS — E039 Hypothyroidism, unspecified: Secondary | ICD-10-CM

## 2024-06-09 DIAGNOSIS — I10 Essential (primary) hypertension: Secondary | ICD-10-CM

## 2024-06-09 DIAGNOSIS — E782 Mixed hyperlipidemia: Secondary | ICD-10-CM

## 2024-06-09 DIAGNOSIS — M109 Gout, unspecified: Secondary | ICD-10-CM

## 2024-06-09 NOTE — Telephone Encounter (Signed)
 Future lab orders placed

## 2024-06-09 NOTE — Telephone Encounter (Signed)
 Please order labs for pt apt 07/13/2024. Pt has CPE with Dettinger on 07/15/2024.

## 2024-07-13 ENCOUNTER — Other Ambulatory Visit

## 2024-07-13 DIAGNOSIS — I10 Essential (primary) hypertension: Secondary | ICD-10-CM

## 2024-07-13 DIAGNOSIS — M109 Gout, unspecified: Secondary | ICD-10-CM

## 2024-07-13 DIAGNOSIS — E782 Mixed hyperlipidemia: Secondary | ICD-10-CM

## 2024-07-13 DIAGNOSIS — E039 Hypothyroidism, unspecified: Secondary | ICD-10-CM

## 2024-07-13 LAB — LIPID PANEL

## 2024-07-14 LAB — CMP14+EGFR
ALT: 27 IU/L (ref 0–44)
AST: 28 IU/L (ref 0–40)
Albumin: 4.1 g/dL (ref 3.9–4.9)
Alkaline Phosphatase: 63 IU/L (ref 44–121)
BUN/Creatinine Ratio: 13 (ref 10–24)
BUN: 15 mg/dL (ref 8–27)
Bilirubin Total: 1.4 mg/dL — AB (ref 0.0–1.2)
CO2: 22 mmol/L (ref 20–29)
Calcium: 9.2 mg/dL (ref 8.6–10.2)
Chloride: 100 mmol/L (ref 96–106)
Creatinine, Ser: 1.19 mg/dL (ref 0.76–1.27)
Globulin, Total: 3.2 g/dL (ref 1.5–4.5)
Glucose: 135 mg/dL — AB (ref 70–99)
Potassium: 4.2 mmol/L (ref 3.5–5.2)
Sodium: 137 mmol/L (ref 134–144)
Total Protein: 7.3 g/dL (ref 6.0–8.5)
eGFR: 67 mL/min/1.73 (ref >59)

## 2024-07-14 LAB — LIPID PANEL
Cholesterol, Total: 188 mg/dL (ref 100–199)
HDL: 30 mg/dL — AB (ref >39)
LDL CALC COMMENT:: 6.3 ratio — AB (ref 0.0–5.0)
LDL Chol Calc (NIH): 93 mg/dL (ref 0–99)
Triglycerides: 389 mg/dL — AB (ref 0–149)
VLDL Cholesterol Cal: 65 mg/dL — AB (ref 5–40)

## 2024-07-14 LAB — CBC WITH DIFFERENTIAL/PLATELET
Basophils Absolute: 0.1 x10E3/uL (ref 0.0–0.2)
Basos: 2 %
EOS (ABSOLUTE): 0.3 x10E3/uL (ref 0.0–0.4)
Eos: 5 %
Hematocrit: 45.6 % (ref 37.5–51.0)
Hemoglobin: 15.2 g/dL (ref 13.0–17.7)
Immature Grans (Abs): 0 x10E3/uL (ref 0.0–0.1)
Immature Granulocytes: 0 %
Lymphocytes Absolute: 2.3 x10E3/uL (ref 0.7–3.1)
Lymphs: 40 %
MCH: 31.9 pg (ref 26.6–33.0)
MCHC: 33.3 g/dL (ref 31.5–35.7)
MCV: 96 fL (ref 79–97)
Monocytes Absolute: 0.5 x10E3/uL (ref 0.1–0.9)
Monocytes: 9 %
Neutrophils Absolute: 2.4 x10E3/uL (ref 1.4–7.0)
Neutrophils: 44 %
Platelets: 278 x10E3/uL (ref 150–450)
RBC: 4.76 x10E6/uL (ref 4.14–5.80)
RDW: 13.1 % (ref 11.6–15.4)
WBC: 5.6 x10E3/uL (ref 3.4–10.8)

## 2024-07-14 LAB — TSH: TSH: 3.24 u[IU]/mL (ref 0.450–4.500)

## 2024-07-14 LAB — URIC ACID: Uric Acid: 5 mg/dL (ref 3.8–8.4)

## 2024-07-15 ENCOUNTER — Ambulatory Visit (INDEPENDENT_AMBULATORY_CARE_PROVIDER_SITE_OTHER): Admitting: Family Medicine

## 2024-07-15 ENCOUNTER — Encounter: Payer: Self-pay | Admitting: Family Medicine

## 2024-07-15 ENCOUNTER — Telehealth: Payer: Self-pay | Admitting: Family Medicine

## 2024-07-15 VITALS — BP 130/83 | HR 64 | Ht 66.0 in | Wt 232.0 lb

## 2024-07-15 DIAGNOSIS — N529 Male erectile dysfunction, unspecified: Secondary | ICD-10-CM

## 2024-07-15 DIAGNOSIS — Z0001 Encounter for general adult medical examination with abnormal findings: Secondary | ICD-10-CM | POA: Diagnosis not present

## 2024-07-15 DIAGNOSIS — I10 Essential (primary) hypertension: Secondary | ICD-10-CM

## 2024-07-15 DIAGNOSIS — E782 Mixed hyperlipidemia: Secondary | ICD-10-CM | POA: Diagnosis not present

## 2024-07-15 DIAGNOSIS — Z0000 Encounter for general adult medical examination without abnormal findings: Secondary | ICD-10-CM

## 2024-07-15 DIAGNOSIS — E039 Hypothyroidism, unspecified: Secondary | ICD-10-CM

## 2024-07-15 MED ORDER — LEVOTHYROXINE SODIUM 75 MCG PO TABS: 75.0000 ug | ORAL_TABLET | Freq: Every day | ORAL | 3 refills | Status: AC

## 2024-07-15 MED ORDER — ALLOPURINOL 300 MG PO TABS: 300.0000 mg | ORAL_TABLET | Freq: Every day | ORAL | 3 refills | Status: AC

## 2024-07-15 MED ORDER — INDOMETHACIN ER 75 MG PO CPCR: ORAL_CAPSULE | ORAL | 2 refills | Status: AC

## 2024-07-15 MED ORDER — SILDENAFIL CITRATE 20 MG PO TABS: 20.0000 mg | ORAL_TABLET | ORAL | 1 refills | Status: AC | PRN

## 2024-07-15 MED ORDER — FENOFIBRATE 48 MG PO TABS: 48.0000 mg | ORAL_TABLET | Freq: Every day | ORAL | 3 refills | Status: AC

## 2024-07-15 MED ORDER — AMLODIPINE BESYLATE 5 MG PO TABS: 5.0000 mg | ORAL_TABLET | Freq: Every day | ORAL | 3 refills | Status: AC

## 2024-07-15 NOTE — Progress Notes (Signed)
 BP 130/83   Pulse 64   Ht 5' 6 (1.676 m)   Wt 232 lb (105.2 kg)   SpO2 97%   BMI 37.45 kg/m    Subjective:   Patient ID: Don Nelson, male    DOB: 1956-05-24, 68 y.o.   MRN: [id number]  HPI: Don Nelson is a 68 y.o. male presenting on 07/15/2024 for Medical Management of Chronic Issues (CPE)   Discussed the use of AI scribe software for clinical note transcription with the patient, who gave verbal consent to proceed.  History of Present Illness   Don Nelson is a 68 year old male who presents for a physical and recheck of his health.  He has a work-related shoulder injury, specifically a torn rotator cuff, and was advised to undergo a reverse total shoulder replacement. He has not pursued the surgery due to denial of workers' compensation and concerns about job security. He experiences weakness during overhead activities but manages his tasks by adapting his methods.  He discontinued a medication about a month ago to assess its effect on his fasting blood work. His triglycerides have increased since stopping the medication, and he acknowledges consuming a lot of fried foods, which may have contributed to this. He plans to resume the medication to manage his triglyceride levels.  His blood sugar levels have been slightly elevated, which he attributes to increased consumption of sourdough bread and limited physical activity due to weather conditions. He acknowledges the need to reduce carbohydrate intake and increase exercise.  He has a history of knee replacement and received a steroid injection in his knee about two months ago. He experiences swelling in his legs after standing on concrete for extended periods, which resolves overnight. He has tried compression stockings but finds them uncomfortable.  He has experienced occasional difficulty swallowing dry foods like rice, which he attributes to inadequate hydration. No heartburn or acid reflux.  He inquired about  obtaining Viagra  but has not filled the prescription due to difficulty using GoodRx.  He is planning to retire soon and intends to resume exercise at a local gym, which he used to attend before it was sold during the COVID-19 pandemic.          Relevant past medical, surgical, family and social history reviewed and updated as indicated. Interim medical history since our last visit reviewed. Allergies and medications reviewed and updated.  Review of Systems  Constitutional:  Negative for chills and fever.  HENT:  Negative for ear pain and tinnitus.   Eyes:  Negative for pain and discharge.  Respiratory:  Negative for cough, shortness of breath and wheezing.   Cardiovascular:  Negative for chest pain, palpitations and leg swelling.  Gastrointestinal:  Negative for abdominal pain, blood in stool, constipation and diarrhea.  Genitourinary:  Negative for dysuria and hematuria.  Musculoskeletal:  Positive for arthralgias. Negative for back pain, gait problem and myalgias.  Skin:  Negative for rash.  Neurological:  Negative for dizziness, weakness and headaches.  Psychiatric/Behavioral:  Negative for suicidal ideas.   All other systems reviewed and are negative.   Per HPI unless specifically indicated above   Allergies as of 07/15/2024   No Known Allergies      Medication List        Accurate as of July 15, 2024 11:17 AM. If you have any questions, ask your nurse or doctor.          allopurinol  300 MG tablet Commonly known as: ZYLOPRIM  Take 1  tablet (300 mg total) by mouth daily.   amLODipine  5 MG tablet Commonly known as: NORVASC  Take 1 tablet (5 mg total) by mouth daily.   fenofibrate  48 MG tablet Commonly known as: TRICOR  Take 1 tablet (48 mg total) by mouth daily.   indomethacin  75 MG CR capsule Commonly known as: INDOCIN  SR TAKE 1 CAPSULE BY MOUTH EVERY 8 TO 12 HOURS AS NEEDED   levothyroxine  75 MCG tablet Commonly known as: Unithroid  Take 1 tablet (75  mcg total) by mouth daily before breakfast. What changed: See the new instructions. Changed by: Fonda LABOR Saamiya Jeppsen   sildenafil  20 MG tablet Commonly known as: REVATIO  Take 1-5 tablets (20-100 mg total) by mouth as needed.         Objective:   BP 130/83   Pulse 64   Ht 5' 6 (1.676 m)   Wt 232 lb (105.2 kg)   SpO2 97%   BMI 37.45 kg/m   Wt Readings from Last 3 Encounters:  07/15/24 232 lb (105.2 kg)  07/24/23 226 lb 3.2 oz (102.6 kg)  02/28/23 229 lb 9.6 oz (104.1 kg)    Physical Exam Physical Exam   VITALS: BP- 130/83 HEENT: Pharynx normal. Ears normal. CHEST: Lungs clear to auscultation. CARDIOVASCULAR: Regular heart sounds, no murmurs. ABDOMEN: Abdomen non-tender. No abdominal masses. EXTREMITIES: Mild swelling in extremities.       Results for orders placed or performed in visit on 07/13/24  Uric acid   Collection Time: 07/13/24  8:32 AM  Result Value Ref Range   Uric Acid 5.0 3.8 - 8.4 mg/dL  TSH   Collection Time: 07/13/24  8:32 AM  Result Value Ref Range   TSH 3.240 0.450 - 4.500 uIU/mL  Lipid panel   Collection Time: 07/13/24  8:32 AM  Result Value Ref Range   Cholesterol, Total 188 100 - 199 mg/dL   Triglycerides 610 (H) 0 - 149 mg/dL   HDL 30 (L) >60 mg/dL   VLDL Cholesterol Cal 65 (H) 5 - 40 mg/dL   LDL Chol Calc (NIH) 93 0 - 99 mg/dL   Chol/HDL Ratio 6.3 (H) 0.0 - 5.0 ratio  CMP14+EGFR   Collection Time: 07/13/24  8:32 AM  Result Value Ref Range   Glucose 135 (H) 70 - 99 mg/dL   BUN 15 8 - 27 mg/dL   Creatinine, Ser 8.80 0.76 - 1.27 mg/dL   eGFR 67 >40 fO/fpw/8.26   BUN/Creatinine Ratio 13 10 - 24   Sodium 137 134 - 144 mmol/L   Potassium 4.2 3.5 - 5.2 mmol/L   Chloride 100 96 - 106 mmol/L   CO2 22 20 - 29 mmol/L   Calcium  9.2 8.6 - 10.2 mg/dL   Total Protein 7.3 6.0 - 8.5 g/dL   Albumin 4.1 3.9 - 4.9 g/dL   Globulin, Total 3.2 1.5 - 4.5 g/dL   Bilirubin Total 1.4 (H) 0.0 - 1.2 mg/dL   Alkaline Phosphatase 63 44 - 121 IU/L   AST 28  0 - 40 IU/L   ALT 27 0 - 44 IU/L  CBC with Differential/Platelet   Collection Time: 07/13/24  8:32 AM  Result Value Ref Range   WBC 5.6 3.4 - 10.8 x10E3/uL   RBC 4.76 4.14 - 5.80 x10E6/uL   Hemoglobin 15.2 13.0 - 17.7 g/dL   Hematocrit 54.3 62.4 - 51.0 %   MCV 96 79 - 97 fL   MCH 31.9 26.6 - 33.0 pg   MCHC 33.3 31.5 - 35.7 g/dL  RDW 13.1 11.6 - 15.4 %   Platelets 278 150 - 450 x10E3/uL   Neutrophils 44 Not Estab. %   Lymphs 40 Not Estab. %   Monocytes 9 Not Estab. %   Eos 5 Not Estab. %   Basos 2 Not Estab. %   Neutrophils Absolute 2.4 1.4 - 7.0 x10E3/uL   Lymphocytes Absolute 2.3 0.7 - 3.1 x10E3/uL   Monocytes Absolute 0.5 0.1 - 0.9 x10E3/uL   EOS (ABSOLUTE) 0.3 0.0 - 0.4 x10E3/uL   Basophils Absolute 0.1 0.0 - 0.2 x10E3/uL   Immature Granulocytes 0 Not Estab. %   Immature Grans (Abs) 0.0 0.0 - 0.1 x10E3/uL    Assessment & Plan:   Problem List Items Addressed This Visit       Cardiovascular and Mediastinum   Hypertension   Relevant Medications   amLODipine  (NORVASC ) 5 MG tablet   fenofibrate  (TRICOR ) 48 MG tablet   sildenafil  (REVATIO ) 20 MG tablet     Endocrine   Hypothyroidism   Relevant Medications   levothyroxine  (UNITHROID ) 75 MCG tablet     Other   Hyperlipidemia - Primary   Relevant Medications   amLODipine  (NORVASC ) 5 MG tablet   fenofibrate  (TRICOR ) 48 MG tablet   sildenafil  (REVATIO ) 20 MG tablet   Other Visit Diagnoses       Physical exam       Relevant Medications   allopurinol  (ZYLOPRIM ) 300 MG tablet   amLODipine  (NORVASC ) 5 MG tablet   indomethacin  (INDOCIN  SR) 75 MG CR capsule   levothyroxine  (UNITHROID ) 75 MCG tablet     Essential hypertension       Relevant Medications   amLODipine  (NORVASC ) 5 MG tablet   fenofibrate  (TRICOR ) 48 MG tablet   sildenafil  (REVATIO ) 20 MG tablet     Erectile dysfunction, unspecified erectile dysfunction type       Relevant Medications   sildenafil  (REVATIO ) 20 MG tablet          Mixed  hyperlipidemia Triglycerides elevated, likely due to fenofibrate  discontinuation and diet. LDL normal. - Resume fenofibrate . - Advise reducing high-carb and fried foods.  Borderline elevated blood glucose Slightly elevated glucose levels, risk of diabetes due to diet and lack of exercise. - Advise reducing carbohydrates, including bread, rice, and potatoes. - Encourage regular exercise.  Essential hypertension Blood pressure controlled with amlodipine . - Continue amlodipine .  Lower extremity swelling Mild swelling from prolonged standing, resolves overnight. Compression stockings uncomfortable. - Reassure normalcy of mild swelling with standing. - Consider compression stockings if comfortable.  Male erectile dysfunction Viagra  not filled due to GoodRx issues. Discussed cost-reduction options. - Reissue Viagra  prescription. - Instruct on using GoodRx for reduced cost.          Follow up plan: Return in about 1 year (around 07/15/2025), or if symptoms worsen or fail to improve, for Physical exam.  Counseling provided for all of the vaccine components No orders of the defined types were placed in this encounter.   Fonda Levins, MD [facility] 07/15/2024, 11:17 AM

## 2024-07-15 NOTE — Telephone Encounter (Signed)
 PSA was not drawn at last visit. Pts PSA was normal last year and has been every year prior. Per Dettinger PSA can be checked every 1-2 years. Advised pt or wife to call back if they would like for the PSA to be added.

## 2024-07-15 NOTE — Telephone Encounter (Signed)
 Copied from CRM #[id number]. Topic: Clinical - Lab/Test Results >> Jul 15, 2024  1:19 PM Rosaria E wrote: Reason for CRM: Pt's wife called requesting a call back from the clinic, wants to know if the pt had PSA labs drawn? Please advise   Best contact: [phone]

## 2024-09-02 ENCOUNTER — Other Ambulatory Visit: Payer: Self-pay | Admitting: Family Medicine

## 2024-09-02 DIAGNOSIS — Z0000 Encounter for general adult medical examination without abnormal findings: Secondary | ICD-10-CM

## 2025-07-18 ENCOUNTER — Encounter: Payer: Self-pay | Admitting: Family Medicine
